# Patient Record
Sex: Female | Born: 1945 | Race: White | Hispanic: No | Marital: Married | Smoking: Current every day smoker
Health system: Southern US, Community
[De-identification: ages and names within clinical notes are randomized; demographics above are authoritative.]

## PROBLEM LIST (undated history)

## (undated) DIAGNOSIS — Z9849 Cataract extraction status, unspecified eye: Secondary | ICD-10-CM

## (undated) DIAGNOSIS — Z8673 Personal history of transient ischemic attack (TIA), and cerebral infarction without residual deficits: Secondary | ICD-10-CM

## (undated) DIAGNOSIS — I10 Essential (primary) hypertension: Secondary | ICD-10-CM

## (undated) DIAGNOSIS — E119 Type 2 diabetes mellitus without complications: Secondary | ICD-10-CM

## (undated) DIAGNOSIS — C52 Malignant neoplasm of vagina: Secondary | ICD-10-CM

## (undated) DIAGNOSIS — E785 Hyperlipidemia, unspecified: Secondary | ICD-10-CM

## (undated) HISTORY — PX: BREAST BIOPSY: SHX20

## (undated) HISTORY — DX: Hyperlipidemia, unspecified: E78.5

## (undated) HISTORY — DX: Type 2 diabetes mellitus without complications: E11.9

## (undated) HISTORY — DX: Personal history of transient ischemic attack (TIA), and cerebral infarction without residual deficits: Z86.73

## (undated) HISTORY — DX: Malignant neoplasm of vagina: C52

## (undated) HISTORY — DX: Cataract extraction status, unspecified eye: Z98.49

## (undated) HISTORY — DX: Essential (primary) hypertension: I10

## (undated) HISTORY — PX: ABDOMINAL HYSTERECTOMY: SHX81

---

## 2005-05-19 ENCOUNTER — Ambulatory Visit: Payer: Self-pay

## 2011-03-25 ENCOUNTER — Ambulatory Visit: Payer: Self-pay | Admitting: Ophthalmology

## 2011-06-03 ENCOUNTER — Ambulatory Visit: Payer: Self-pay | Admitting: Ophthalmology

## 2012-04-21 ENCOUNTER — Inpatient Hospital Stay: Payer: Self-pay | Admitting: Internal Medicine

## 2012-04-21 DIAGNOSIS — Z8673 Personal history of transient ischemic attack (TIA), and cerebral infarction without residual deficits: Secondary | ICD-10-CM

## 2012-04-21 HISTORY — DX: Personal history of transient ischemic attack (TIA), and cerebral infarction without residual deficits: Z86.73

## 2012-04-21 LAB — CBC WITH DIFFERENTIAL/PLATELET
Basophil %: 0.5 %
Eosinophil %: 0.9 %
HCT: 43.2 % (ref 35.0–47.0)
HGB: 14.7 g/dL (ref 12.0–16.0)
Lymphocyte #: 2.2 10*3/uL (ref 1.0–3.6)
MCH: 31.7 pg (ref 26.0–34.0)
MCHC: 34.1 g/dL (ref 32.0–36.0)
MCV: 93 fL (ref 80–100)
Monocyte #: 0.5 x10 3/mm (ref 0.2–0.9)
Monocyte %: 5.8 %
Neutrophil #: 6.5 10*3/uL (ref 1.4–6.5)
Neutrophil %: 69.4 %
RBC: 4.65 10*6/uL (ref 3.80–5.20)

## 2012-04-21 LAB — CK TOTAL AND CKMB (NOT AT ARMC)
CK, Total: 62 U/L (ref 21–215)
CK-MB: 0.8 ng/mL (ref 0.5–3.6)

## 2012-04-21 LAB — COMPREHENSIVE METABOLIC PANEL
Alkaline Phosphatase: 87 U/L (ref 50–136)
Anion Gap: 7 (ref 7–16)
BUN: 14 mg/dL (ref 7–18)
Calcium, Total: 9.5 mg/dL (ref 8.5–10.1)
Chloride: 105 mmol/L (ref 98–107)
Co2: 27 mmol/L (ref 21–32)
EGFR (African American): >60
EGFR (Non-African Amer.): >60
Glucose: 201 mg/dL — ABNORMAL HIGH (ref 65–99)
SGOT(AST): 24 U/L (ref 15–37)
SGPT (ALT): 20 U/L (ref 12–78)
Total Protein: 7.5 g/dL (ref 6.4–8.2)

## 2012-04-21 LAB — LIPID PANEL
Cholesterol: 225 mg/dL — ABNORMAL HIGH (ref 0–200)
Triglycerides: 159 mg/dL (ref 0–200)

## 2012-04-21 LAB — PROTIME-INR
INR: 0.9
Prothrombin Time: 12 secs (ref 11.5–14.7)

## 2012-04-21 LAB — APTT: Activated PTT: 31.8 secs (ref 23.6–35.9)

## 2012-04-22 DIAGNOSIS — I517 Cardiomegaly: Secondary | ICD-10-CM

## 2012-04-22 LAB — CBC WITH DIFFERENTIAL/PLATELET
Lymphocyte #: 3.6 10*3/uL (ref 1.0–3.6)
Lymphocyte %: 36.4 %
MCHC: 32.9 g/dL (ref 32.0–36.0)
MCV: 94 fL (ref 80–100)
Monocyte #: 0.7 x10 3/mm (ref 0.2–0.9)
Monocyte %: 7.6 %
Neutrophil %: 53.5 %
Platelet: 196 10*3/uL (ref 150–440)
RDW: 14.1 % (ref 11.5–14.5)
WBC: 9.8 10*3/uL (ref 3.6–11.0)

## 2012-04-22 LAB — CK TOTAL AND CKMB (NOT AT ARMC)
CK-MB: 0.8 ng/mL (ref 0.5–3.6)
CK-MB: 1 ng/mL (ref 0.5–3.6)

## 2012-04-22 LAB — BASIC METABOLIC PANEL
Anion Gap: 7 (ref 7–16)
BUN: 13 mg/dL (ref 7–18)
Creatinine: 0.77 mg/dL (ref 0.60–1.30)
EGFR (African American): >60
EGFR (Non-African Amer.): >60
Osmolality: 288 (ref 275–301)
Sodium: 144 mmol/L (ref 136–145)

## 2012-04-22 LAB — TROPONIN I: Troponin-I: <0.02 ng/mL

## 2014-05-18 ENCOUNTER — Ambulatory Visit: Payer: Self-pay | Admitting: Family Medicine

## 2014-12-19 ENCOUNTER — Ambulatory Visit
Admit: 2014-12-19 | Disposition: A | Discharge status: Home / Self Care | Payer: Self-pay | Attending: Family Medicine | Admitting: Family Medicine

## 2014-12-19 NOTE — Discharge Summary (Signed)
PATIENT NAME:  [name], [name] MR#:  [id number] DATE OF BIRTH:  1945/11/16  DATE OF ADMISSION:  04/21/2012 DATE OF DISCHARGE:  04/23/2012   ADMITTING DIAGNOSIS: Left arm tingling and numbness as well as facial numbness.   DISCHARGE DIAGNOSES:  1. Left arm tingling and numbness due to acute cerebrovascular accident involving inferior posterior limb of the internal capsule, also tiny punctate acute infarct in the posterior aspect of the right parietal lobe.  2. Diet-controlled diabetes.  3. Accelerated hypertension on presentation, likely due to acute stroke.  4. Hyperlipidemia.  5. Nicotine addiction.   6. 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  7. Status post cataract surgery.  PERTINENT LABS AND EVALUATIONS: WBC 9.4, hemoglobin 14.7, platelet count 29. PT was 12, INR 0.9. Troponin was less than 0.02. CT scan of the head without contrast showing no acute intracranial abnormality. EKG showed sinus bradycardia. Hemoglobin A1c was 7.3. Fasting lipid panel: Total cholesterol 225, triglycerides 159, HDL 59, LDL 134. Troponin was less than 0.02. LFTs were normal. PA and lateral chest x-ray showed no acute abnormality. An echocardiogram of the heart showed no source of emboli identified. Normal ejection fraction. There is moderate concentric left ventricular hypertrophy and there is a flow pattern suggestive of impaired LV relaxation. Ultrasound of the bilateral carotid Dopplers showed no significant atherosclerotic plaques identified in the Dopplers and vertebral arteries were patent. MRI of the brain showed acute infarct in the posterior limb of the right internal capsule. Tiny punctate area of acute infarct in the posterior aspect of the right parietal lobe cannot be excluded.   HOSPITAL COURSE: Please refer to the History and Physical done by the admitting physician. The patient is a pleasant 69 year old Caucasian female with history of diet-controlled  diabetes who presented with left arm tingling, numbness, and facial droop. By the time she arrived in the ED a lot of her symptoms had resolved, except she continued to have left facial numbness and some numbness in her arm. She had a CT scan of the head which was negative. The patient was admitted for further evaluation for stroke work-up. An MRI did confirm she indeed had a stroke in the posterior limb right internal capsule. I discussed this case with Dr. Loletta Specter of neurology who stated that this is unlikely cardiac emboli. She did have carotid Doppler's which showed no significant stenosis. Echo showed no evidence of cardiac source of emboli noted. The patient was not on aspirin. She was started on aspirin. Her cholesterol was also noted to be elevated. She was started on cholesterol-lowering medications.  Most of her symptoms from this cerebrovascular accident have resolved. She is ambulating without any difficulty, does not need any rehab. The patient also was noted to have accelerated hypertension on presentation, likely due to acute stroke. Her blood pressure is currently stable. She will be started on a low dose antihypertensive. At this time she is stable for discharge. The patient also smokes. She was counseled regarding quitting of smoking.   DISCHARGE MEDICATIONS: 1. Aspirin 81 mg 1 tab p.o. daily.  2. Simvastatin 20 mg at bedtime.  3. Lisinopril 2.5 daily.   HOME OXYGEN: None.   DIET: Low sodium, low fat, low cholesterol, carbohydrate consistent diet.   ACTIVITY: As tolerated.  REFERRAL:  Outpatient diabetic referral.   TIMEFRAME FOR FOLLOWUP: 1 to 2 weeks with [facility] or [facility] as a new patient.  The patient is recommended to stop smoking. She is also asked  to keep a log of her blood sugars to take to the primary M.D.  Faythe Ghee for the patient to return to work without any restrictions on Monday.   TIME SPENT:  35  minutes.  ____________________________ Lafonda Mosses Posey Pronto, MD shp:bjt D: 04/23/2012 10:36:46 ET T: 04/23/2012 11:13:37 ET JOB#: [id number]  cc: Chike Farrington H. Posey Pronto, MD, <Dictator> Alric Seton MD ELECTRONICALLY SIGNED 04/23/2012 16:03

## 2014-12-19 NOTE — H&P (Signed)
PATIENT NAME:  [name], FORDE MR#:  [id number] DATE OF BIRTH:  1945-11-30  DATE OF ADMISSION:  04/21/2012  PRIMARY CARE PHYSICIAN: None.   ADMITTING PHYSICIAN: Gladstone Lighter, MD   CHIEF COMPLAINT: Left arm tingling and numbness.   HISTORY OF PRESENT ILLNESS: [name] is a 69 year old Caucasian female with no significant past medical history other than borderline diabetes mellitus for which she is not on any medications, presents to the hospital with the above-mentioned complaints. The patient says she was fine this morning. About 2:00 p.m. this afternoon, she started feeling tingling in her left hand which attributed to a tight watch, so she took the watch out; and then she felt that the tingling and numbness was going all the way up along the left arm to her face, and she felt some tingling around the lip on the left side. She denies any change in her vision, speech, swallowing, or gait. Her symptoms are mostly resolved now. There is no tingling or numbness, but she still feels funny in her left arm when she is trying to lift it.   PAST MEDICAL HISTORY:  1. Borderline diabetes mellitus.  2. Vaginal carcinoma.   PAST SURGICAL HISTORY:  1. Hysterectomy with vaginal resection secondary to carcinoma.  2. Cataract surgery.   ALLERGIES: No known drug allergies.   CURRENT MEDICATIONS: None prescribed. Over-the-counter herbal medications.   SOCIAL HISTORY: She lives at home with her husband. She continues to smoke about 1/2 pack per day, used to smoke more than 2 packs in the past. She started smoking when she was 69 years old. No history of any alcohol use or drug abuse. She currently works at [employer] in [employer]. She used to work in [employer] in the past.   FAMILY HISTORY: Mother with heart disease. Father had atherosclerotic arterial disease.   REVIEW OF SYSTEMS: CONSTITUTIONAL: No fever, fatigue, or weakness. EYES: No blurred vision, double vision, glaucoma or cataracts. ENT:  Positive for mild hearing loss from her working in [employer]. No tinnitus, ear pain, epistaxis or discharge. RESPIRATORY: No cough, wheeze, hemoptysis, or chronic obstructive pulmonary disease. CARDIOVASCULAR: No chest pain, orthopnea, edema, arrhythmia, palpitations, or syncope. GI: No nausea, vomiting, abdominal pain, hematemesis, or melena. GENITOURINARY: No dysuria, hematuria, renal calculus, frequency, or incontinence. ENDOCRINE: No polyuria, nocturia, thyroid problems, heat or cold intolerance. HEMATOLOGY: No anemia, easy bruising or bleeding. SKIN: No acne, rash, or lesions. MUSCULOSKELETAL: No neck, back, shoulder pain, arthritis, or gout. NEUROLOGIC: Positive for numbness, tingling of left arm and also face. No prior history of cerebrovascular accident or transient ischemic attack or seizures. PSYCHOLOGICAL: No anxiety, insomnia, or depression.   PHYSICAL EXAMINATION:  VITAL SIGNS: Temperature 98.9 degrees Fahrenheit, pulse 67, respirations 20, blood pressure of 204/194, pulse oximetry 98% on room air.   GENERAL: A well built and well nourished female lying in bed, not in any acute distress.   HEENT: Normocephalic, atraumatic. Pupils are equal, round, reacting to light. Anicteric sclerae. Extraocular movements intact. Oropharynx clear without erythema, mass or exudates.   NECK: Supple. No thyromegaly, JVD, or carotid bruits. No lymphadenopathy.   LUNGS: Clear to auscultation bilaterally. No wheeze or crackles.    NECK: Supple, no thyromegaly, JVD, or carotid bruits. No lymphadenopathy.   LUNGS: Clear to auscultation bilaterally. Some rhonchi heard in both bases. No wheeze or crackles. No use of accessory muscles for breathing.   CARDIOVASCULAR: S1, S2 regular rate and rhythm. No murmurs, rubs, or gallops.   ABDOMEN: Soft, nontender, nondistended. No  hepatosplenomegaly. Normal bowel sounds.   EXTREMITIES: No pedal edema. No clubbing or cyanosis. 2+ dorsalis pedis pulses  bilaterally.   SKIN: No acne, rash, or lesions.   LYMPH: No cervical lymphadenopathy.   NEUROLOGICAL: Cranial nerves are intact. Her strength is five out of five both lower extremities. Strength in the right upper extremity is 5 out of 5 and left upper extremity is 5 out of 5 in the proximal muscles and also hand grip but appears 4+ out of 5 in the distal forearm muscles. Her knee jerk reflexes, ankle jerk and also biceps are 2+ on exam. There are no  sensory changes to touch and temperature on my examination at this time.    PSYCHOLOGICAL: The patient is awake, alert and oriented x3.   LABORATORY, DIAGNOSTIC AND RADIOLOGICAL DATA: WBC 9.4, hemoglobin 14.7, hematocrit 43.2, platelet count 29. Comprehensive metabolic panel is pending. PT 12.0, INR 0.9, PTT 31.8. Troponin less than 0.02. CT of the head without contrast showing no acute intracranial abnormality. EKG showing sinus bradycardia, heart rate of 58.   ASSESSMENT AND PLAN: A 69 year old female with no significant past medical history, borderline diabetes mellitus, admitted for left facial and also arm tingling and numbness which was resolved also now.   1. Acute transient ischemic attack/cerebrovascular accident: Symptoms resolved. The patient still feels funny lifting her left arm. Since CT is negative, we will  admit and do MRI of the brain, carotid Dopplers, echocardiogram. Monitor on telemetry and no neuro checks frequently.  2. PT consult.  3. Started on aspirin since she was not taking any antiplatelet agents prior to coming to the hospital and also statin while lipid panel is pending at this time.   4. 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 We will not drastically drop the blood pressure to maintain cerebral perfusion at this time. Do IV hydralazine p.r.n. at this time and add medications as needed.  5. Borderline diabetes mellitus: Hemoglobin A1c is pending, on sliding scale insulin.  6. Tobacco use  disorder: Counseled for three minutes, and the patient refused nicotine patch.  7. GI and deep vein thrombosis prophylaxis: On ranitidine and Lovenox.   CODE STATUS:  FULL CODE.      TIME SPENT ON ADMISSION: 50 minutes   ____________________________ Gladstone Lighter, MD [id number] D: 04/21/2012 17:12:12 ET T: 04/21/2012 18:18:44 ET JOB#: [id number]  cc: Gladstone Lighter, MD, <Dictator> Gladstone Lighter MD ELECTRONICALLY SIGNED 04/21/2012 20:32

## 2015-07-07 ENCOUNTER — Encounter: Payer: Self-pay | Admitting: Cardiology

## 2017-02-23 ENCOUNTER — Other Ambulatory Visit: Payer: Self-pay | Admitting: Family Medicine

## 2017-02-23 DIAGNOSIS — Z1231 Encounter for screening mammogram for malignant neoplasm of breast: Secondary | ICD-10-CM

## 2017-03-02 ENCOUNTER — Encounter: Payer: Self-pay | Admitting: Radiology

## 2017-03-02 ENCOUNTER — Ambulatory Visit
Admission: RE | Admit: 2017-03-02 | Discharge: 2017-03-02 | Disposition: A | Discharge status: Home / Self Care | Payer: Medicare PPO | Source: Ambulatory Visit | Attending: Family Medicine | Admitting: Family Medicine

## 2017-03-02 DIAGNOSIS — Z1231 Encounter for screening mammogram for malignant neoplasm of breast: Secondary | ICD-10-CM | POA: Insufficient documentation

## 2017-04-28 ENCOUNTER — Ambulatory Visit
Admission: EM | Admit: 2017-04-28 | Discharge: 2017-04-28 | Disposition: A | Discharge status: Home / Self Care | Payer: Medicare PPO | Attending: Family Medicine | Admitting: Family Medicine

## 2017-04-28 ENCOUNTER — Encounter: Payer: Self-pay | Admitting: *Deleted

## 2017-04-28 DIAGNOSIS — R42 Dizziness and giddiness: Secondary | ICD-10-CM | POA: Diagnosis not present

## 2017-04-28 DIAGNOSIS — A084 Viral intestinal infection, unspecified: Secondary | ICD-10-CM

## 2017-04-28 LAB — COMPREHENSIVE METABOLIC PANEL
ALBUMIN: 3.5 g/dL (ref 3.5–5.0)
ALT: 17 U/L (ref 14–54)
ANION GAP: 8 (ref 5–15)
AST: 22 U/L (ref 15–41)
Alkaline Phosphatase: 75 U/L (ref 38–126)
BILIRUBIN TOTAL: 0.2 mg/dL — AB (ref 0.3–1.2)
BUN: 22 mg/dL — AB (ref 6–20)
CALCIUM: 9.3 mg/dL (ref 8.9–10.3)
CO2: 25 mmol/L (ref 22–32)
CREATININE: 1.02 mg/dL — AB (ref 0.44–1.00)
Chloride: 100 mmol/L — ABNORMAL LOW (ref 101–111)
GFR calc Af Amer: >60 mL/min (ref >60)
GFR calc non Af Amer: 54 mL/min — ABNORMAL LOW (ref >60)
GLUCOSE: 288 mg/dL — AB (ref 65–99)
Potassium: 4 mmol/L (ref 3.5–5.1)
Sodium: 133 mmol/L — ABNORMAL LOW (ref 135–145)
Total Protein: 7.1 g/dL (ref 6.5–8.1)

## 2017-04-28 NOTE — ED Triage Notes (Signed)
Pt states she "didn't feel right" this morning when she got up and as day progressed began to feel more and more fatigues and dizzy. Denies pain or other symptoms. Negative slurred speech, arm drift, or facial ptosis.

## 2017-04-28 NOTE — ED Provider Notes (Signed)
[facility]    CSN: [id number] Arrival date & time: 04/28/17  1756     History   Chief Complaint Chief Complaint  Patient presents with  . Dizziness  . Fatigue    HPI Harry Bark is a 71 y.o. female.   71 yo female with a c/o general fatigue/weakness today and an episode of lightheadedness this afternoon at work. Denies syncope. States she's been having watery diarrhea for the last week. Denies any fevers, chills, melena, hematochezia, chest pains, shortness of breath, numbness/tingling, vision changes.    The history is provided by the patient.  Dizziness    Past Medical History:  Diagnosis Date  . 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 Diabetes mellitus type 2, controlled ([place])   . Essential hypertension   . H/O: CVA (cerebrovascular accident) 04/21/2012   inferior posterior limb of the internal capsule, also tiny punctate acute infarct in the posterior aspect of the right parietal lobe  . Hyperlipidemia   . Status post cataract surgery     There are no active problems to display for this patient.   Past Surgical History:  Procedure Laterality Date  . ABDOMINAL HYSTERECTOMY     vaginal resection  . BREAST BIOPSY Left    neg    OB History    No data available       Home Medications    Prior to Admission medications   Medication Sig Start Date End Date Taking? Authorizing Provider  metFORMIN (GLUCOPHAGE) 500 MG tablet Take by mouth 2 (two) times daily with a meal.   Yes [provider]    Family History Family History  Problem Relation Age of Onset  . Peripheral vascular disease Father   . Heart disease Mother   . Breast cancer Neg Hx     Social History Social History  Substance Use Topics  . Smoking status: Current Every Day Smoker  . Smokeless tobacco: Never Used  . Alcohol use No     Allergies   Patient has no known allergies.   Review of Systems Review  of Systems  Neurological: Positive for dizziness.     Physical Exam Triage Vital Signs ED Triage Vitals  Enc Vitals Group     BP 04/28/17 1834 (!) 197/61     Pulse Rate 04/28/17 1834 63     Resp 04/28/17 1834 16     Temp 04/28/17 1834 98.3 F (36.8 C)     Temp Source 04/28/17 1834 Oral     SpO2 04/28/17 1834 98 %     Weight 04/28/17 1837 160 lb (72.6 kg)     Height 04/28/17 1837 5\' 3"  (1.6 m)     Head Circumference --      Peak Flow --      Pain Score --      Pain Loc --      Pain Edu? --      Excl. in Impact? --    No data found.   Updated Vital Signs BP (!) 188/84 (BP Location: Left Arm)   Pulse 63   Temp 98.3 F (36.8 C) (Oral)   Resp 16   Ht 5\' 3"  (1.6 m)   Wt 160 lb (72.6 kg)   SpO2 98%   BMI 28.34 kg/m   Visual Acuity Right Eye Distance:   Left Eye Distance:   Bilateral Distance:    Right Eye Near:   Left Eye Near:  Bilateral Near:     Physical Exam  Constitutional: She is oriented to person, place, and time. She appears well-developed and well-nourished. No distress.  HENT:  Head: Normocephalic and atraumatic.  Right Ear: Tympanic membrane, external ear and ear canal normal.  Left Ear: Tympanic membrane, external ear and ear canal normal.  Nose: No mucosal edema, rhinorrhea, nose lacerations, sinus tenderness, nasal deformity, septal deviation or nasal septal hematoma. No epistaxis.  No foreign bodies. Right sinus exhibits no maxillary sinus tenderness and no frontal sinus tenderness. Left sinus exhibits no maxillary sinus tenderness and no frontal sinus tenderness.  Mouth/Throat: Uvula is midline, oropharynx is clear and moist and mucous membranes are normal. No oropharyngeal exudate.  Eyes: Pupils are equal, round, and reactive to light. Conjunctivae and EOM are normal. Right eye exhibits no discharge. Left eye exhibits no discharge. No scleral icterus.  Neck: Normal range of motion. Neck supple. No thyromegaly present.  Cardiovascular: Normal rate,  regular rhythm and normal heart sounds.   Pulmonary/Chest: Effort normal and breath sounds normal. No respiratory distress. She has no wheezes. She has no rales.  Abdominal: Soft. Bowel sounds are normal. She exhibits no distension and no mass. There is no tenderness. There is no rebound and no guarding. No hernia.  Lymphadenopathy:    She has no cervical adenopathy.  Neurological: She is alert and oriented to person, place, and time. She displays normal reflexes. No cranial nerve deficit or sensory deficit. She exhibits normal muscle tone. Coordination normal.  Skin: She is not diaphoretic.  Nursing note and vitals reviewed.    UC Treatments / Results  Labs (all labs ordered are listed, but only abnormal results are displayed) Labs Reviewed  COMPREHENSIVE METABOLIC PANEL - Abnormal; Notable for the following:       Result Value   Sodium 133 (*)    Chloride 100 (*)    Glucose, Bld 288 (*)    BUN 22 (*)    Creatinine, Ser 1.02 (*)    Total Bilirubin 0.2 (*)    GFR calc non Af Amer 54 (*)    All other components within normal limits    EKG  EKG Interpretation None       Radiology No results found.  Procedures Procedures (including critical care time)  Medications Ordered in UC Medications - No data to display   Initial Impression / Assessment and Plan / UC Course  I have reviewed the triage vital signs and the nursing notes.  Pertinent labs & imaging results that were available during my care of the patient were reviewed by me and considered in my medical decision making (see chart for details).       Final Clinical Impressions(s) / UC Diagnoses   Final diagnoses:  Viral gastroenteritis  Lightheadedness    New Prescriptions Discharge Medication List as of 04/28/2017  8:55 PM     1. Lab results and diagnosis reviewed with patient 2. Recommend supportive treatment with increased fluids, Imodium AD prn; continue home medications 3. Follow-up prn if symptoms  worsen or don't improve  Controlled Substance Prescriptions [place] Controlled Substance Registry consulted? Not Applicable   Norval Gable, MD 04/28/17 2103

## 2017-04-28 NOTE — Discharge Instructions (Signed)
Increase fluids Follow up with primary care provider

## 2017-12-23 ENCOUNTER — Other Ambulatory Visit: Payer: Self-pay | Admitting: Family Medicine

## 2017-12-23 DIAGNOSIS — Z1231 Encounter for screening mammogram for malignant neoplasm of breast: Secondary | ICD-10-CM

## 2019-03-17 ENCOUNTER — Other Ambulatory Visit: Payer: Self-pay | Admitting: Family Medicine

## 2019-03-17 DIAGNOSIS — Z1231 Encounter for screening mammogram for malignant neoplasm of breast: Secondary | ICD-10-CM

## 2019-04-05 ENCOUNTER — Other Ambulatory Visit: Payer: Self-pay

## 2019-04-05 ENCOUNTER — Ambulatory Visit
Admission: RE | Admit: 2019-04-05 | Discharge: 2019-04-05 | Disposition: A | Discharge status: Home / Self Care | Payer: Medicare PPO | Source: Ambulatory Visit | Attending: Family Medicine | Admitting: Family Medicine

## 2019-04-05 DIAGNOSIS — Z1231 Encounter for screening mammogram for malignant neoplasm of breast: Secondary | ICD-10-CM | POA: Diagnosis not present

## 2020-03-22 ENCOUNTER — Other Ambulatory Visit: Payer: Self-pay | Admitting: Family Medicine

## 2020-03-22 DIAGNOSIS — Z1231 Encounter for screening mammogram for malignant neoplasm of breast: Secondary | ICD-10-CM

## 2020-04-03 ENCOUNTER — Other Ambulatory Visit: Payer: Self-pay | Admitting: Family Medicine

## 2020-04-03 DIAGNOSIS — M949 Disorder of cartilage, unspecified: Secondary | ICD-10-CM

## 2020-04-03 DIAGNOSIS — M899 Disorder of bone, unspecified: Secondary | ICD-10-CM

## 2020-10-04 IMAGING — MG DIGITAL SCREENING BILATERAL MAMMOGRAM WITH TOMO AND CAD
8 series · 8 of 24 positions shown · non-contrast
Comparison: Previous exam(s).

CLINICAL DATA: Screening.

EXAM:
DIGITAL SCREENING BILATERAL MAMMOGRAM WITH TOMO AND CAD

[R CC synth-2D]
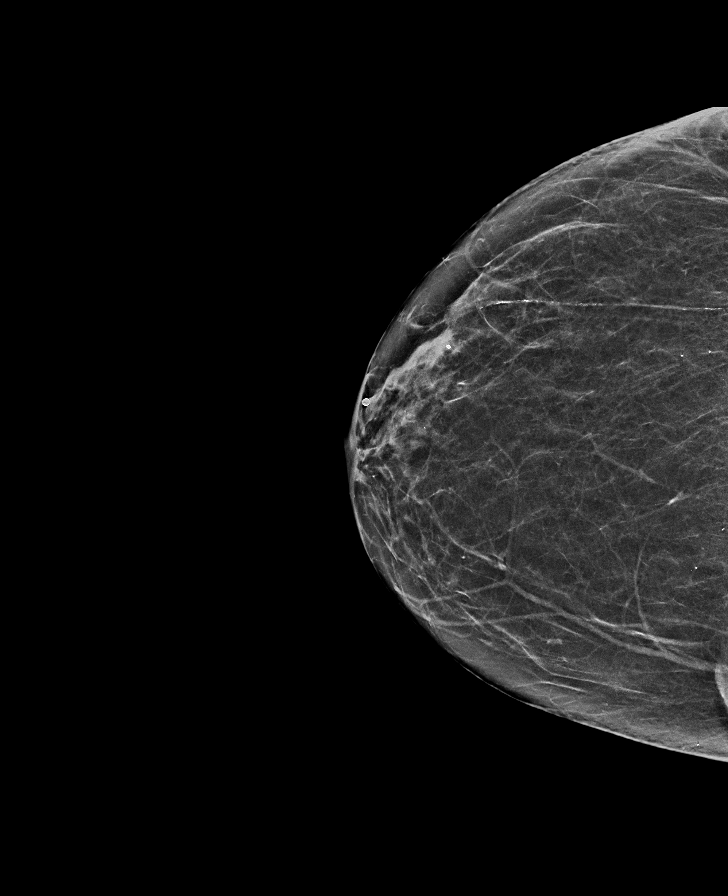

[L MLO synth-2D]
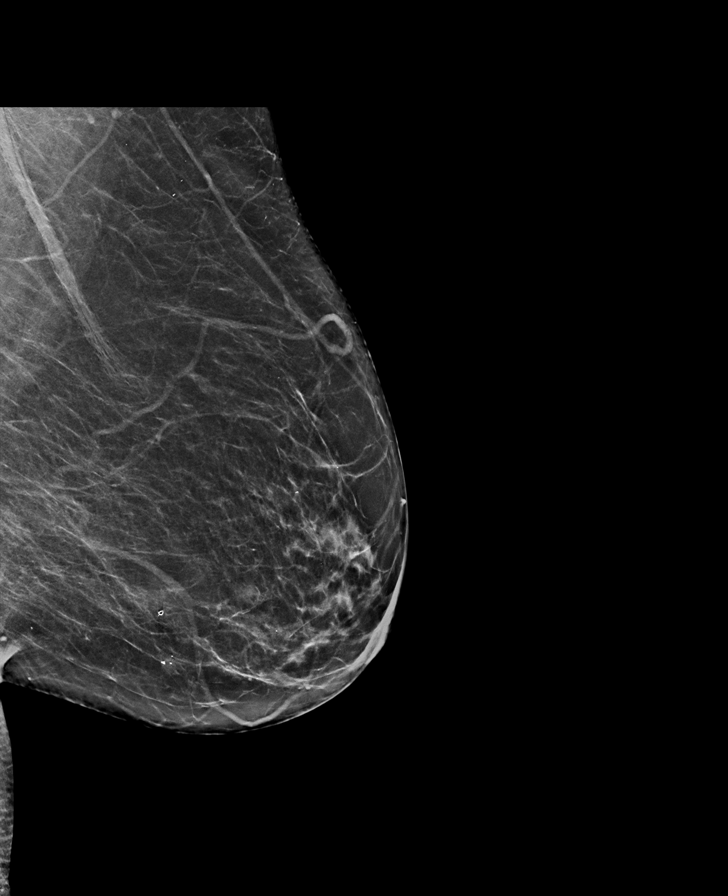

[R MLO synth-2D]
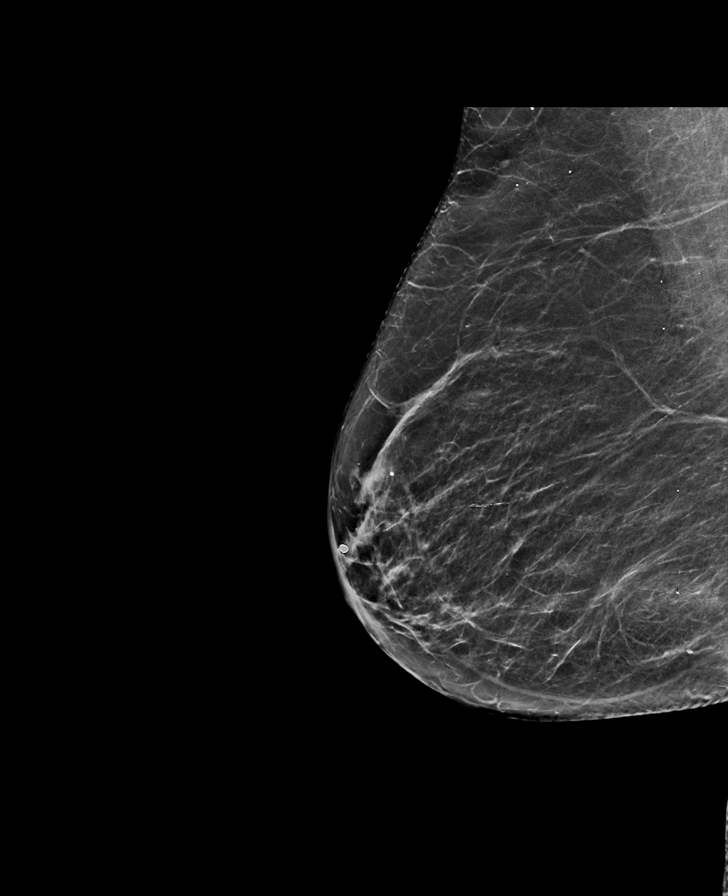

[L CC synth-2D]
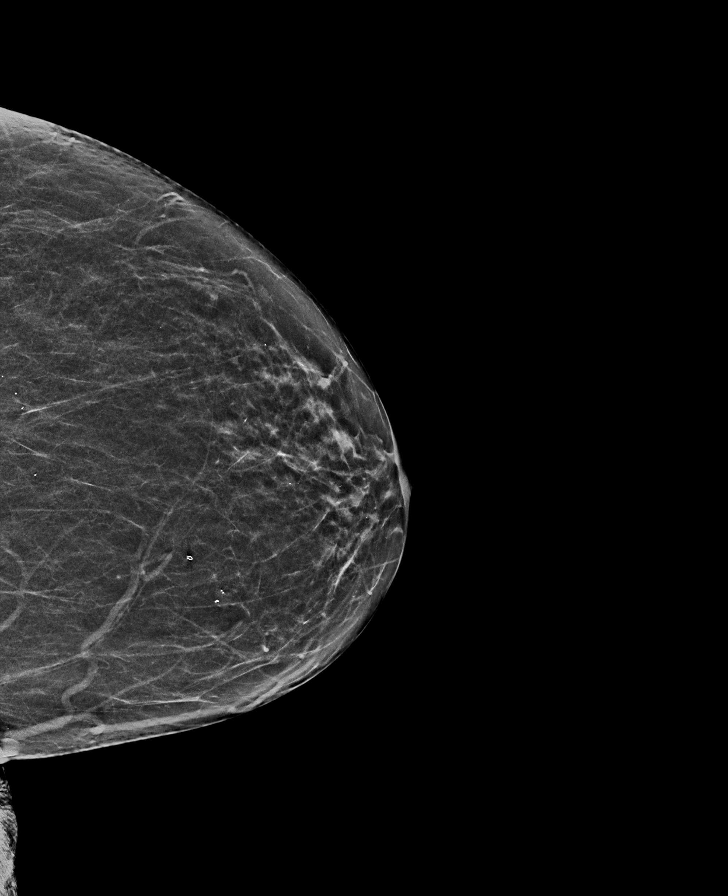

[R MLO tomo · tomo slice 31/62.0]
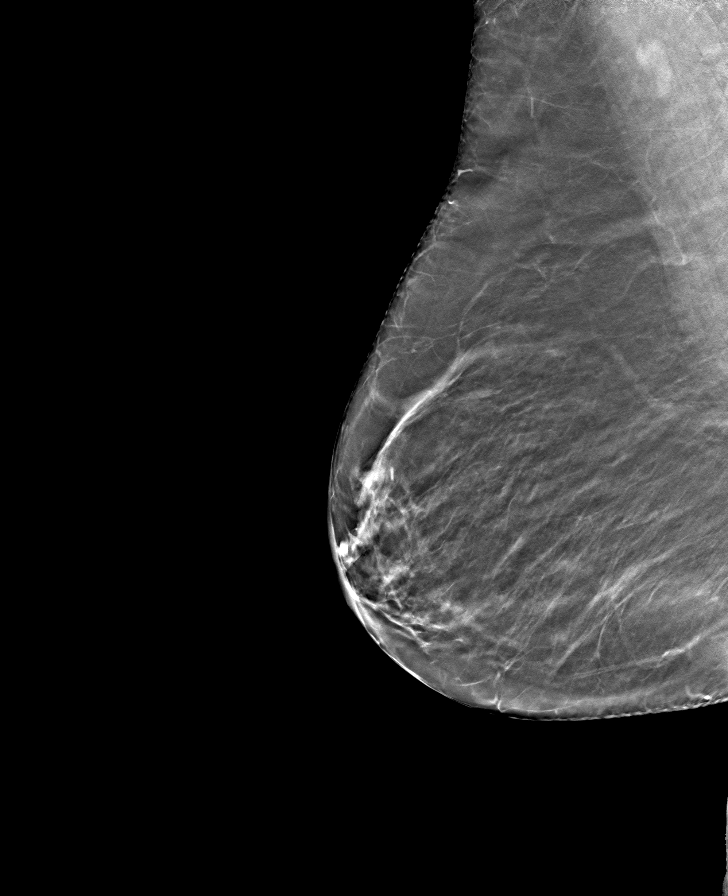

[R CC tomo · tomo slice 29/56.0]
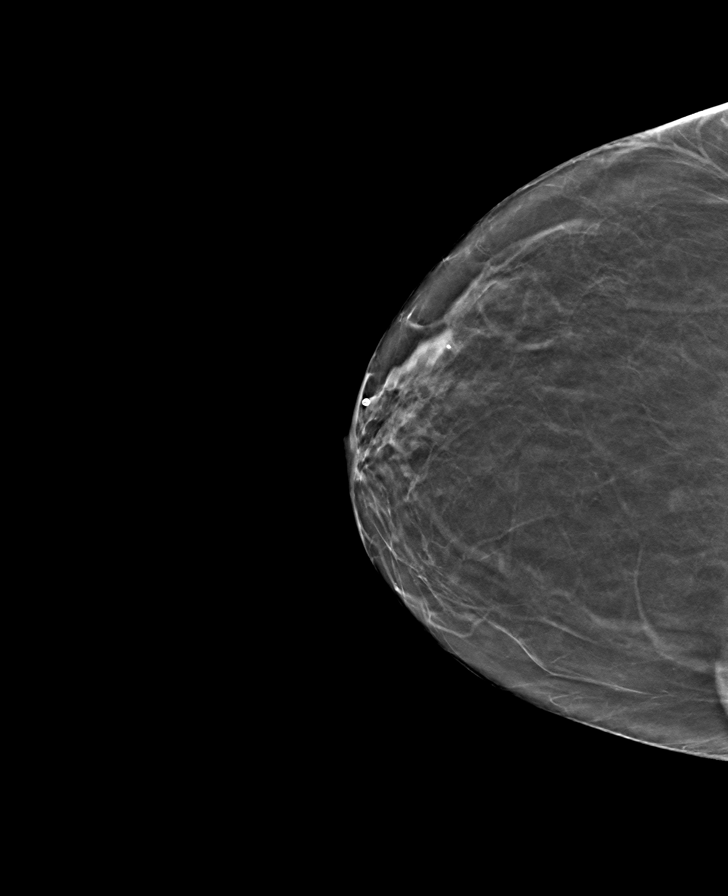

[L CC tomo · tomo slice 28/55.0]
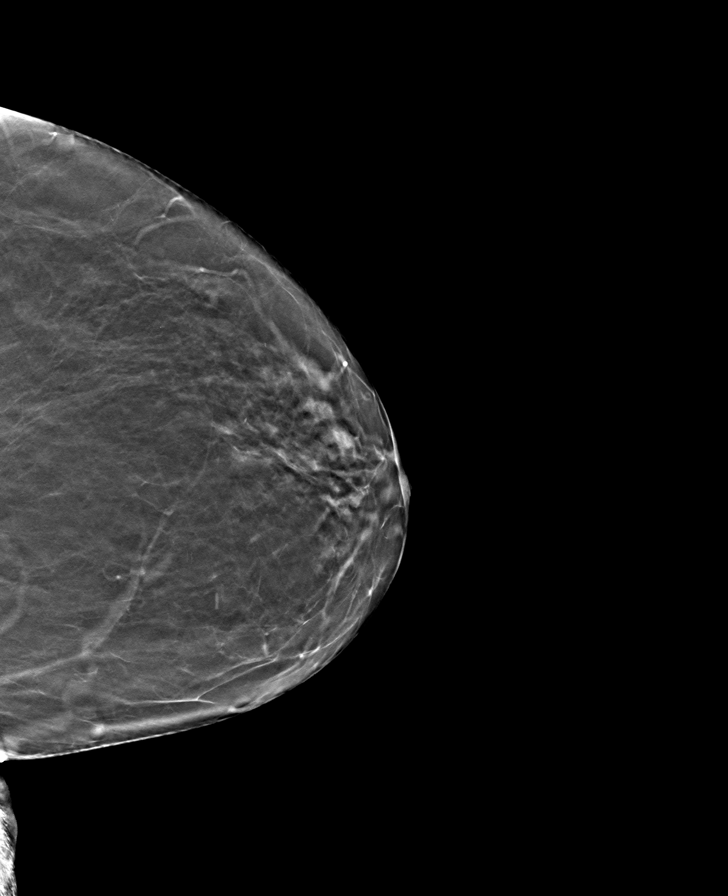

[L MLO tomo · tomo slice 35/68.0]
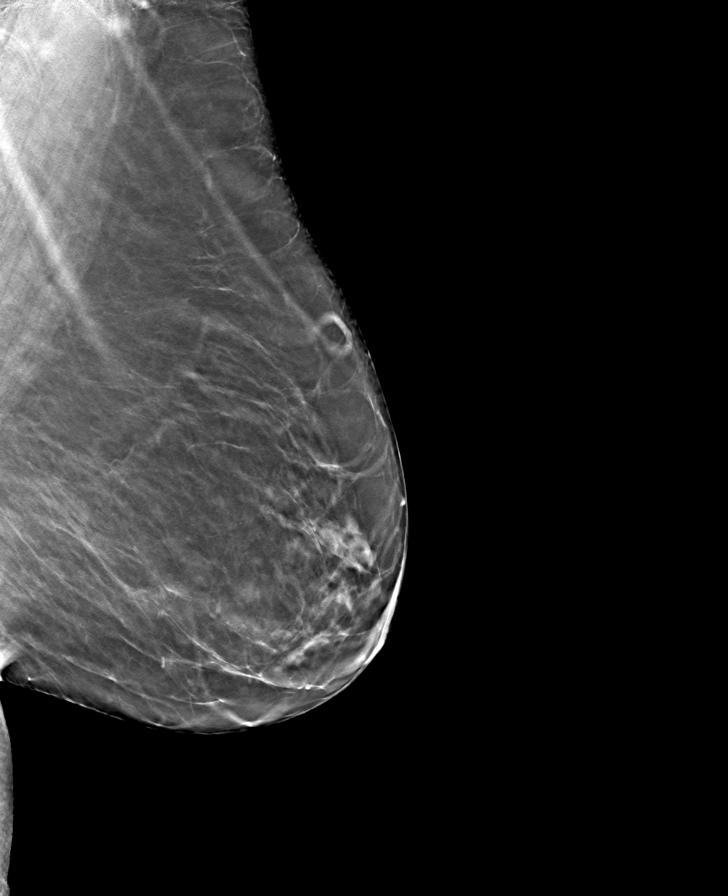

[8 of 24 positions shown; findings below may reference images not displayed]

ACR Breast Density Category b: There are scattered areas of
fibroglandular density.
FINDINGS: There are no findings suspicious for malignancy. Images were
processed with CAD.
IMPRESSION: No mammographic evidence of malignancy. A result letter of this
screening mammogram will be mailed directly to the patient.

RECOMMENDATION:
Screening mammogram in one year. (Code:CN-U-775)

BI-RADS CATEGORY  1: Negative.

## 2021-09-25 ENCOUNTER — Other Ambulatory Visit: Payer: Self-pay

## 2021-09-25 ENCOUNTER — Ambulatory Visit
Admission: EM | Admit: 2021-09-25 | Discharge: 2021-09-25 | Disposition: A | Discharge status: Home / Self Care | Payer: Medicare PPO

## 2021-09-25 ENCOUNTER — Encounter: Payer: Self-pay | Admitting: Emergency Medicine

## 2021-09-25 DIAGNOSIS — M545 Low back pain, unspecified: Secondary | ICD-10-CM

## 2021-09-25 DIAGNOSIS — M79604 Pain in right leg: Secondary | ICD-10-CM

## 2021-09-25 DIAGNOSIS — S60512A Abrasion of left hand, initial encounter: Secondary | ICD-10-CM | POA: Diagnosis not present

## 2021-09-25 MED ORDER — BACLOFEN 5 MG PO TABS: 5.0000 mg | ORAL_TABLET | Freq: Every evening | ORAL | 0 refills | Status: DC | PRN

## 2021-09-25 MED ORDER — NAPROXEN 500 MG PO TABS: 500.0000 mg | ORAL_TABLET | Freq: Two times a day (BID) | ORAL | 0 refills | Status: DC

## 2021-09-25 NOTE — Discharge Instructions (Signed)
Your pain is most likely caused by irritation to the muscles or ligaments.   You may use heating pad in 15 minute intervals as needed for additional comfort, within the first 2-3 days you may find comfort in using ice in 10-15 minutes over affected area  Begin stretching affected area daily for 10 minutes as tolerated to further loosen muscles   When lying down place pillow underneath and between knees for support  Can try sleeping without pillow on firm mattress   Practice good posture: head back, shoulders back, chest forward, pelvis back and weight distributed evenly on both legs  If pain persist after recommended treatment or reoccurs if may be beneficial to follow up with orthopedic specialist for evaluation, this doctor specializes in the bones and can manage your symptoms long-term with options such as but not limited to imaging, medications or physical therapy
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
## 2021-11-12 ENCOUNTER — Other Ambulatory Visit: Payer: Self-pay | Admitting: Family Medicine

## 2021-11-12 DIAGNOSIS — Z1231 Encounter for screening mammogram for malignant neoplasm of breast: Secondary | ICD-10-CM

## 2023-07-27 ENCOUNTER — Encounter: Payer: Self-pay | Admitting: Emergency Medicine

## 2023-07-27 ENCOUNTER — Inpatient Hospital Stay
Admission: EM | Admit: 2023-07-27 | Discharge: 2023-08-01 | DRG: 435 | Disposition: A | Discharge status: Home Health | Payer: Medicare HMO | Attending: Internal Medicine | Admitting: Internal Medicine

## 2023-07-27 ENCOUNTER — Ambulatory Visit
Admission: EM | Admit: 2023-07-27 | Discharge: 2023-07-27 | Payer: Medicare HMO | Attending: Internal Medicine | Admitting: Internal Medicine

## 2023-07-27 ENCOUNTER — Ambulatory Visit: Payer: Medicare HMO

## 2023-07-27 ENCOUNTER — Emergency Department: Payer: Medicare HMO

## 2023-07-27 ENCOUNTER — Other Ambulatory Visit: Payer: Self-pay

## 2023-07-27 DIAGNOSIS — Z8249 Family history of ischemic heart disease and other diseases of the circulatory system: Secondary | ICD-10-CM | POA: Diagnosis not present

## 2023-07-27 DIAGNOSIS — Z7984 Long term (current) use of oral hypoglycemic drugs: Secondary | ICD-10-CM | POA: Diagnosis not present

## 2023-07-27 DIAGNOSIS — F17210 Nicotine dependence, cigarettes, uncomplicated: Secondary | ICD-10-CM | POA: Diagnosis present

## 2023-07-27 DIAGNOSIS — R112 Nausea with vomiting, unspecified: Secondary | ICD-10-CM | POA: Diagnosis not present

## 2023-07-27 DIAGNOSIS — Z539 Procedure and treatment not carried out, unspecified reason: Secondary | ICD-10-CM | POA: Diagnosis present

## 2023-07-27 DIAGNOSIS — K8309 Other cholangitis: Secondary | ICD-10-CM | POA: Diagnosis present

## 2023-07-27 DIAGNOSIS — F40240 Claustrophobia: Secondary | ICD-10-CM | POA: Diagnosis present

## 2023-07-27 DIAGNOSIS — R54 Age-related physical debility: Secondary | ICD-10-CM | POA: Diagnosis present

## 2023-07-27 DIAGNOSIS — Z515 Encounter for palliative care: Secondary | ICD-10-CM | POA: Diagnosis not present

## 2023-07-27 DIAGNOSIS — C787 Secondary malignant neoplasm of liver and intrahepatic bile duct: Secondary | ICD-10-CM | POA: Diagnosis present

## 2023-07-27 DIAGNOSIS — E119 Type 2 diabetes mellitus without complications: Secondary | ICD-10-CM | POA: Diagnosis present

## 2023-07-27 DIAGNOSIS — R7989 Other specified abnormal findings of blood chemistry: Secondary | ICD-10-CM

## 2023-07-27 DIAGNOSIS — K831 Obstruction of bile duct: Secondary | ICD-10-CM | POA: Diagnosis present

## 2023-07-27 DIAGNOSIS — I10 Essential (primary) hypertension: Secondary | ICD-10-CM | POA: Diagnosis present

## 2023-07-27 DIAGNOSIS — E871 Hypo-osmolality and hyponatremia: Secondary | ICD-10-CM | POA: Diagnosis present

## 2023-07-27 DIAGNOSIS — Z8673 Personal history of transient ischemic attack (TIA), and cerebral infarction without residual deficits: Secondary | ICD-10-CM

## 2023-07-27 DIAGNOSIS — Z8542 Personal history of malignant neoplasm of other parts of uterus: Secondary | ICD-10-CM | POA: Diagnosis not present

## 2023-07-27 DIAGNOSIS — Z79899 Other long term (current) drug therapy: Secondary | ICD-10-CM | POA: Diagnosis not present

## 2023-07-27 DIAGNOSIS — R1011 Right upper quadrant pain: Secondary | ICD-10-CM | POA: Diagnosis not present

## 2023-07-27 DIAGNOSIS — Z90710 Acquired absence of both cervix and uterus: Secondary | ICD-10-CM | POA: Diagnosis not present

## 2023-07-27 DIAGNOSIS — C801 Malignant (primary) neoplasm, unspecified: Secondary | ICD-10-CM

## 2023-07-27 DIAGNOSIS — Z7985 Long-term (current) use of injectable non-insulin antidiabetic drugs: Secondary | ICD-10-CM | POA: Diagnosis not present

## 2023-07-27 DIAGNOSIS — E440 Moderate protein-calorie malnutrition: Secondary | ICD-10-CM | POA: Diagnosis present

## 2023-07-27 DIAGNOSIS — R1030 Lower abdominal pain, unspecified: Secondary | ICD-10-CM

## 2023-07-27 DIAGNOSIS — Z7982 Long term (current) use of aspirin: Secondary | ICD-10-CM | POA: Diagnosis not present

## 2023-07-27 DIAGNOSIS — Z6822 Body mass index (BMI) 22.0-22.9, adult: Secondary | ICD-10-CM | POA: Diagnosis not present

## 2023-07-27 DIAGNOSIS — N179 Acute kidney failure, unspecified: Secondary | ICD-10-CM | POA: Diagnosis present

## 2023-07-27 DIAGNOSIS — R1031 Right lower quadrant pain: Secondary | ICD-10-CM | POA: Diagnosis not present

## 2023-07-27 DIAGNOSIS — E785 Hyperlipidemia, unspecified: Secondary | ICD-10-CM | POA: Diagnosis present

## 2023-07-27 DIAGNOSIS — R160 Hepatomegaly, not elsewhere classified: Secondary | ICD-10-CM | POA: Diagnosis not present

## 2023-07-27 DIAGNOSIS — E876 Hypokalemia: Secondary | ICD-10-CM | POA: Diagnosis present

## 2023-07-27 DIAGNOSIS — D72829 Elevated white blood cell count, unspecified: Secondary | ICD-10-CM | POA: Diagnosis not present

## 2023-07-27 LAB — CBC
HCT: 34.7 % — ABNORMAL LOW (ref 36.0–46.0)
Hemoglobin: 11.9 g/dL — ABNORMAL LOW (ref 12.0–15.0)
MCH: 30.6 pg (ref 26.0–34.0)
MCHC: 34.3 g/dL (ref 30.0–36.0)
MCV: 89.2 fL (ref 80.0–100.0)
Platelets: 308 10*3/uL (ref 150–400)
RBC: 3.89 MIL/uL (ref 3.87–5.11)
RDW: 13.6 % (ref 11.5–15.5)
WBC: 14.4 10*3/uL — ABNORMAL HIGH (ref 4.0–10.5)
nRBC: 0 % (ref 0.0–0.2)

## 2023-07-27 LAB — COMPREHENSIVE METABOLIC PANEL
ALT: 269 U/L — ABNORMAL HIGH (ref 0–44)
AST: 162 U/L — ABNORMAL HIGH (ref 15–41)
Albumin: 3.4 g/dL — ABNORMAL LOW (ref 3.5–5.0)
Alkaline Phosphatase: 446 U/L — ABNORMAL HIGH (ref 38–126)
Anion gap: 14 (ref 5–15)
BUN: 38 mg/dL — ABNORMAL HIGH (ref 8–23)
CO2: 28 mmol/L (ref 22–32)
Calcium: 8.9 mg/dL (ref 8.9–10.3)
Chloride: 87 mmol/L — ABNORMAL LOW (ref 98–111)
Creatinine, Ser: 1.27 mg/dL — ABNORMAL HIGH (ref 0.44–1.00)
GFR, Estimated: 44 mL/min — ABNORMAL LOW (ref >60)
Glucose, Bld: 265 mg/dL — ABNORMAL HIGH (ref 70–99)
Potassium: 3.2 mmol/L — ABNORMAL LOW (ref 3.5–5.1)
Sodium: 129 mmol/L — ABNORMAL LOW (ref 135–145)
Total Bilirubin: 2.1 mg/dL — ABNORMAL HIGH (ref <1.2)
Total Protein: 6.7 g/dL (ref 6.5–8.1)

## 2023-07-27 LAB — LIPASE, BLOOD: Lipase: 60 U/L — ABNORMAL HIGH (ref 11–51)

## 2023-07-27 LAB — URINALYSIS, ROUTINE W REFLEX MICROSCOPIC
Bacteria, UA: NONE SEEN
Bilirubin Urine: NEGATIVE
Glucose, UA: >=500 mg/dL — AB
Ketones, ur: 5 mg/dL — AB
Leukocytes,Ua: NEGATIVE
Nitrite: NEGATIVE
Protein, ur: NEGATIVE mg/dL
Specific Gravity, Urine: 1.038 — ABNORMAL HIGH (ref 1.005–1.030)
pH: 6 (ref 5.0–8.0)

## 2023-07-27 MED ORDER — INSULIN ASPART 100 UNIT/ML IJ SOLN
0.0000 [IU] | Freq: Three times a day (TID) | INTRAMUSCULAR | Status: DC
  Administered 2023-07-28: 2 [IU] via SUBCUTANEOUS
  Administered 2023-07-28: 1 [IU] via SUBCUTANEOUS
  Administered 2023-07-29: 2 [IU] via SUBCUTANEOUS
  Administered 2023-07-30 (×2): 3 [IU] via SUBCUTANEOUS
  Administered 2023-07-30: 5 [IU] via SUBCUTANEOUS
  Administered 2023-07-31 (×2): 3 [IU] via SUBCUTANEOUS
  Administered 2023-08-01 (×2): 5 [IU] via SUBCUTANEOUS
  Filled 2023-07-27 (×9): qty 1

## 2023-07-27 MED ORDER — INSULIN ASPART 100 UNIT/ML IJ SOLN
0.0000 [IU] | Freq: Every day | INTRAMUSCULAR | Status: DC
  Administered 2023-07-28 – 2023-07-31 (×2): 2 [IU] via SUBCUTANEOUS
  Filled 2023-07-27 (×2): qty 1

## 2023-07-27 MED ORDER — HYDRALAZINE HCL 20 MG/ML IJ SOLN
10.0000 mg | INTRAMUSCULAR | Status: DC | PRN
  Administered 2023-07-28 – 2023-07-30 (×4): 10 mg via INTRAVENOUS
  Filled 2023-07-27 (×4): qty 1

## 2023-07-27 MED ORDER — ACETAMINOPHEN 325 MG PO TABS
650.0000 mg | ORAL_TABLET | Freq: Four times a day (QID) | ORAL | Status: AC | PRN
  Filled 2023-07-27: qty 2

## 2023-07-27 MED ORDER — HYDRALAZINE HCL 20 MG/ML IJ SOLN: 5.0000 mg | Freq: Four times a day (QID) | INTRAMUSCULAR | Status: DC | PRN

## 2023-07-27 MED ORDER — SODIUM CHLORIDE 0.9 % IV BOLUS
1000.0000 mL | Freq: Once | INTRAVENOUS | Status: AC
  Administered 2023-07-27: 1000 mL via INTRAVENOUS

## 2023-07-27 MED ORDER — METRONIDAZOLE 500 MG/100ML IV SOLN
500.0000 mg | Freq: Once | INTRAVENOUS | Status: AC
  Administered 2023-07-27: 500 mg via INTRAVENOUS
  Filled 2023-07-27: qty 100

## 2023-07-27 MED ORDER — SENNOSIDES-DOCUSATE SODIUM 8.6-50 MG PO TABS: 1.0000 | ORAL_TABLET | Freq: Every evening | ORAL | Status: DC | PRN

## 2023-07-27 MED ORDER — ACETAMINOPHEN 650 MG RE SUPP: 650.0000 mg | Freq: Four times a day (QID) | RECTAL | Status: AC | PRN

## 2023-07-27 MED ORDER — IOHEXOL 300 MG/ML  SOLN
80.0000 mL | Freq: Once | INTRAMUSCULAR | Status: AC | PRN
  Administered 2023-07-27: 80 mL via INTRAVENOUS

## 2023-07-27 MED ORDER — POTASSIUM CHLORIDE CRYS ER 20 MEQ PO TBCR
40.0000 meq | EXTENDED_RELEASE_TABLET | Freq: Once | ORAL | Status: AC
  Administered 2023-07-28: 40 meq via ORAL
  Filled 2023-07-27: qty 2

## 2023-07-27 MED ORDER — METRONIDAZOLE 500 MG/100ML IV SOLN
500.0000 mg | Freq: Two times a day (BID) | INTRAVENOUS | Status: DC
  Administered 2023-07-28 – 2023-07-29 (×3): 500 mg via INTRAVENOUS
  Filled 2023-07-27 (×4): qty 100

## 2023-07-27 MED ORDER — LACTATED RINGERS IV SOLN: INTRAVENOUS | Status: AC

## 2023-07-27 MED ORDER — SODIUM CHLORIDE 0.9 % IV SOLN
2.0000 g | Freq: Once | INTRAVENOUS | Status: AC
  Administered 2023-07-27: 2 g via INTRAVENOUS
  Filled 2023-07-27: qty 12.5

## 2023-07-27 MED ORDER — ONDANSETRON HCL 4 MG PO TABS: 4.0000 mg | ORAL_TABLET | Freq: Four times a day (QID) | ORAL | Status: DC | PRN

## 2023-07-27 MED ORDER — LACTATED RINGERS IV BOLUS (SEPSIS)
500.0000 mL | Freq: Once | INTRAVENOUS | Status: AC
  Administered 2023-07-27: 500 mL via INTRAVENOUS

## 2023-07-27 MED ORDER — ONDANSETRON HCL 4 MG/2ML IJ SOLN
4.0000 mg | Freq: Four times a day (QID) | INTRAMUSCULAR | Status: DC | PRN
  Administered 2023-07-29 – 2023-07-30 (×4): 4 mg via INTRAVENOUS
  Filled 2023-07-27 (×4): qty 2

## 2023-07-27 NOTE — H&P (Addendum)
History and Physical   [name] GNF:[id number] DOB: 06/27/1946 DOA: 07/27/2023  PCP: Rayetta Humphrey, MD  Patient coming from: Urgent care center via POV  I have personally briefly reviewed patient's old medical records in [facility] EMR.  Chief Concern: Abdominal pain  HPI: Ms. [name] is a 78 year old female with history of hypertension, non-insulin-dependent diabetes mellitus, hyperlipidemia, history of tobacco use, who presents to the emergency department for chief concerns of abdominal pain.  Vitals in the ED showed temperature of 98.3, respiration rate 20, heart rate of 68, blood pressure 172/73, SpO2 100% on room air.  Serum sodium is 129, potassium 3.2, chloride 87, bicarb 29, BUN of 38, serum creatinine 1.27, EGFR 44, nonfasting blood glucose 265, WBC 14.4, hemoglobin 11.9, platelets of 308.  UA was negative for leukocytes and nitrates.  ED treatment: Cefepime, metronidazole, sodium chloride 1 L bolus, LR 500 mL bolus, lactated ringer 150 mL/h. ----------------------------------- At bedside, patient is able to tell me her name, age, current calendar year.  Patient reports that she has been having belly pain for the last 3 to 4 days.  She reports that formally she would have diarrhea/loose bowel movements however over the last 4 days she has been constipated.  Patient denies chest pain, shortness of breath, nausea, vomiting, dysuria, hematuria, diarrhea.  She denies blood in her stool.  Patient reports a 12 pound weight loss over the last 3 to 4 months, however she has also recently been started on Ozempic.  She denies trauma to her person.  Social history: She lives at home with her husband.  She currently smokes 4 to 5 cigarettes/day and formally smoked 2 packs/day.  She denies EtOH and recreational drug use.  She is retired and previously worked in a [employer].  ROS: Constitutional: + weight change, no fever ENT/Mouth: no sore throat, no  rhinorrhea Eyes: no eye pain, no vision changes Cardiovascular: no chest pain, no dyspnea,  no edema, no palpitations Respiratory: no cough, no sputum, no wheezing Gastrointestinal: no nausea, no vomiting, no diarrhea, no constipation Genitourinary: no urinary incontinence, no dysuria, no hematuria Musculoskeletal: no arthralgias, no myalgias Skin: no skin lesions, no pruritus, Neuro: + weakness, no loss of consciousness, no syncope Psych: no anxiety, no depression, + decrease appetite Heme/Lymph: no bruising, no bleeding  ED Course: Discussed with the EDP, patient requiring hospitalization for chief concerns of ascending cholangitis.  Assessment/Plan  Principal Problem:   Elevated LFTs Active Problems:   Hypokalemia   Essential hypertension   Hyperlipidemia   Diabetes mellitus type 2, noninsulin dependent (H[facility])   AKI (acute kidney injury) (HCC)   Liver mass   Leukocytosis   Assessment and Plan:  * Elevated LFTs Suspect secondary to cholangitis complicated by possible metastasis versus hepatocellular carcinoma causing dilatation Etiology workup in progress Sepsis cannot be ruled out at this time given patient had leukocytosis of 14.4 with a possible source of ascending cholangitis Check lactic acid and procalcitonin on admission Blood cultures x 2 are in process Per EDP, GI has been consulted who states he will consult on the patient. N.p.o. after midnight for possible biliary stent placement by IR per GI discussion with IR Recheck liver enzymes in the a.m.  Leukocytosis Etiology workup in progress Differentials include sepsis, severe sepsis Procalcitonin, lactic acid x 2 were ordered on admission Blood cultures x 2 are in process Continue broad-spectrum cefepime with metronidazole 500 mg IV twice daily Recheck CBC in a.m.  Liver mass Differentials include metastasis versus  primary hepatocellular carcinoma causing biliary obstruction AM team to consult medical  oncology GI has been consulted by EDP We will keep patient n.p.o. after midnight for biliary stent placement  AKI (acute kidney injury) (HCC) Status post sodium chloride 1 L bolus, LR 500 mL bolus on admission followed by LR infusion at 150 mL/h Recheck BMP in the a.m.  Diabetes mellitus type 2, noninsulin dependent (HCC) Insulin SSI with at bedtime coverage ordered Home metformin and semaglutide not resumed on admission  Hyperlipidemia Home atorvastatin 40 mg daily not resumed on admission due to elevated LFTs  Essential hypertension Uncontrolled, suspect secondary to patient not yet taken her home antihypertensive medications prior to ED presentation Home lisinopril 5 daily not resumed on admission due to acute kidney injury Hydralazine 10 mg IV every 4 hours as needed for SBP greater than 170, 5 days of coverage ordered  Hypokalemia Check magnesium on admission Potassium chloride 40 mill colons p.o. one-time dose ordered Recheck BMP in the a.m.  Chart reviewed.   DVT prophylaxis: AM team to initiate pharmacologic DVT prophylaxis when the benefits outweigh the risk Code Status: Full code Diet: Heart healthy/carb modified; n.p.o. after midnight Family Communication: Updated daughter, Sylvie Farrier at bedside with patient's permission Disposition Plan: Pending clinical course, guarded prognosis Consults called: GI per EDP Admission status: Telemetry medical, inpatient  Past Medical History:  Diagnosis Date   Carcinoma of vaginal vault ([facility])    s/p Hysterectomy with vaginal resection   Diabetes mellitus type 2, controlled ([facility])    Essential hypertension    H/O: CVA (cerebrovascular accident) 04/21/2012   inferior posterior limb of the internal capsule, also tiny punctate acute infarct in the posterior aspect of the right parietal lobe   Hyperlipidemia    Status post cataract surgery    Past Surgical History:  Procedure Laterality Date   ABDOMINAL HYSTERECTOMY      vaginal resection   BREAST BIOPSY Left    bx/clip-neg   Social History:  reports that she has been smoking cigarettes. She has never used smokeless tobacco. She reports that she does not drink alcohol and does not use drugs.  Allergies  Allergen Reactions   Ezetimibe Other (See Comments)    heartburn   Simvastatin Other (See Comments)    Acid reflux symptoms   Niacin Other (See Comments)    Heartburn   Pravastatin Other (See Comments)    Acid reflux   Family History  Problem Relation Age of Onset   Peripheral vascular disease Father    Heart disease Mother    Breast cancer Neg Hx    Family history: Family history reviewed and not pertinent.  Prior to Admission medications   Medication Sig Start Date End Date Taking? Authorizing Provider  aspirin 81 MG chewable tablet Chew by mouth.    [provider]  baclofen 5 MG TABS Take 5 mg by mouth at bedtime as needed for muscle spasms. 09/25/21   White, Elita Boone, NP  lisinopril (ZESTRIL) 5 MG tablet Take 1 tablet by mouth 2 (two) times daily. 09/21/19   [provider]  metFORMIN (GLUCOPHAGE) 500 MG tablet Take by mouth 2 (two) times daily with a meal.    [provider]  naproxen (NAPROSYN) 500 MG tablet Take 1 tablet (500 mg total) by mouth 2 (two) times daily. 09/25/21   Valinda Hoar, NP  Semaglutide,0.25 or 0.5MG /DOS, 2 MG/3ML SOPN Inject into the skin. 04/07/23   [provider]   Physical Exam: Vitals:  07/27/23 2220 07/27/23 2240 07/27/23 2240 07/27/23 2300  BP: (!) 172/73  (!) 172/73 (!) 193/80  Pulse: 68  68 63  Resp:   20   Temp:  98.3 F (36.8 C)    TempSrc:  Oral    SpO2: 100%  100% 99%  Weight:      Height:       Constitutional: appears age-appropriate, frail, NAD, calm Eyes: PERRL, lids and conjunctivae normal ENMT: Mucous membranes are moist. Posterior pharynx clear of any exudate or lesions. Age-appropriate dentition. Hearing appropriate Neck: normal, supple, no masses,  no thyromegaly Respiratory: clear to auscultation bilaterally, no wheezing, no crackles. Normal respiratory effort. No accessory muscle use.  Cardiovascular: Regular rate and rhythm, no murmurs / rubs / gallops. No extremity edema. 2+ pedal pulses. No carotid bruits.  Abdomen: Generalized abdominal tenderness, especially on the right upper quadrant, no masses palpated, no hepatosplenomegaly. Bowel sounds positive.  Musculoskeletal: no clubbing / cyanosis. No joint deformity upper and lower extremities. Good ROM, no contractures, no atrophy. Normal muscle tone.  Skin: no rashes, lesions, ulcers. No induration Neurologic: Sensation intact. Strength 5/5 in all 4.  Psychiatric: Normal judgment and insight. Alert and oriented x 3. Normal mood.   EKG: Ordered and pending completion  X-ray on Admission: I personally reviewed and I agree with radiologist reading as below.  CT ABDOMEN PELVIS W CONTRAST  Result Date: 07/27/2023 CLINICAL DATA:  Right lower quadrant abdominal pain. No bowel movement in 3 days EXAM: CT ABDOMEN AND PELVIS WITH CONTRAST TECHNIQUE: Multidetector CT imaging of the abdomen and pelvis was performed using the standard protocol following bolus administration of intravenous contrast. RADIATION DOSE REDUCTION: This exam was performed according to the departmental dose-optimization program which includes automated exposure control, adjustment of the mA and/or kV according to patient size and/or use of iterative reconstruction technique. CONTRAST:  80mL OMNIPAQUE IOHEXOL 300 MG/ML  SOLN COMPARISON:  Same day abdominal radiograph FINDINGS: Lower chest: No acute abnormality. Hepatobiliary: Ill-defined focus of hypoattenuation in the left hepatic lobe superior to the gallbladder. This measures 4.1 x 2.4 cm. Normal gallbladder. The intrahepatic bile ducts are moderately dilated. No dilation of the common bile duct. No radiopaque stone. Pancreas: Unremarkable. No pancreatic ductal dilatation or  surrounding inflammatory changes. Spleen: Unremarkable. Adrenals/Urinary Tract: Bilateral adrenal gland hyperplasia. There is nodular thickening in the left adrenal gland measuring 1.5 cm mild enhancement (Hounsfield units 73). Low-attenuation lesions in the kidneys are statistically likely to represent cysts. No follow-up is required. No urinary calculi or hydronephrosis. Stomach/Bowel: Large stool ball in the rectum. Moderate colonic stool load. Extensive colonic diverticulosis without evidence of diverticulitis. Stomach is within normal limits. The appendix is normal. Vascular/Lymphatic: Aortic atherosclerosis. Portal vein is patent. No enlarged abdominal or pelvic lymph nodes. Reproductive: Status post hysterectomy. No adnexal masses. Other: No free intraperitoneal fluid or air. Musculoskeletal: No acute fracture or destructive osseous lesion. IMPRESSION: 1. Constipation with large stool ball in the rectum. 2. Ill-defined focus of hypoattenuation in the left hepatic lobe superior to the gallbladder measuring 4.1 cm. Given associated intrahepatic biliary dilation this is concerning for metastasis or primary HCC causing biliary obstruction. Nonemergent hepatic protocol MRI of the abdomen with and without contrast is recommended. 3. Hyperplasia of the bilateral adrenal glands with 1.5 cm nodular thickening in the left adrenal gland. This can also be further evaluated with MRI. 4. Extensive colonic diverticulosis without diverticulitis. Aortic Atherosclerosis (ICD10-I70.0). Electronically Signed   By: Minerva Fester M.D.   On: 07/27/2023 21:39  DG Abd 1 View  Result Date: 07/27/2023 CLINICAL DATA:  Abdomen pain EXAM: ABDOMEN - 1 VIEW COMPARISON:  None Available. FINDINGS: Nonobstructed gas pattern. Large stool in the colon and rectum. Numerous pelvic surgical clips. Pelvic phleboliths. IMPRESSION: Nonobstructed gas pattern with large stool in the colon and rectum suggesting constipation. Electronically Signed    By: Jasmine Pang M.D.   On: 07/27/2023 17:02    Labs on Admission: I have personally reviewed following labs  CBC: Recent Labs  Lab 07/27/23 1920  WBC 14.4*  HGB 11.9*  HCT 34.7*  MCV 89.2  PLT 308   Basic Metabolic Panel: Recent Labs  Lab 07/27/23 1920  NA 129*  K 3.2*  CL 87*  CO2 28  GLUCOSE 265*  BUN 38*  CREATININE 1.27*  CALCIUM 8.9   GFR: Estimated Creatinine Clearance: 30.7 mL/min (A) (by C-G formula based on SCr of 1.27 mg/dL (H)).  Liver Function Tests: Recent Labs  Lab 07/27/23 1920  AST 162*  ALT 269*  ALKPHOS 446*  BILITOT 2.1*  PROT 6.7  ALBUMIN 3.4*   Recent Labs  Lab 07/27/23 1920  LIPASE 60*   Urine analysis:    Component Value Date/Time   COLORURINE YELLOW (A) 07/27/2023 1921   APPEARANCEUR CLEAR (A) 07/27/2023 1921   LABSPEC 1.038 (H) 07/27/2023 1921   PHURINE 6.0 07/27/2023 1921   GLUCOSEU >=500 (A) 07/27/2023 1921   HGBUR SMALL (A) 07/27/2023 1921   BILIRUBINUR NEGATIVE 07/27/2023 1921   KETONESUR 5 (A) 07/27/2023 1921   PROTEINUR NEGATIVE 07/27/2023 1921   NITRITE NEGATIVE 07/27/2023 1921   LEUKOCYTESUR NEGATIVE 07/27/2023 1921   This document was prepared using Dragon Voice Recognition software and may include unintentional dictation errors.  Dr. Sedalia Muta [facility]  If 7PM-7AM, please contact overnight-coverage provider If 7AM-7PM, please contact day attending provider [email or web address]  07/27/2023, 11:38 PM

## 2023-07-27 NOTE — Assessment & Plan Note (Signed)
Status post sodium chloride 1 L bolus, LR 500 mL bolus on admission followed by LR infusion at 150 mL/h Recheck BMP in the a.m.

## 2023-07-27 NOTE — Assessment & Plan Note (Deleted)
Etiology workup in progress Sepsis cannot be ruled out at this time given patient had leukocytosis of 14.4 with a possible source of ascending cholangitis Check lactic acid and procalcitonin on admission Blood cultures x 2 are in process Per EDP, GI has been consulted **   ** Recheck liver enzymes in the a.m.

## 2023-07-27 NOTE — ED Notes (Signed)
Iv had to be restarted in left arm.  When taking out iv in right arm, tape tore skin.  Neosporin applied to right forearm.  Gauze dressing in place.   Md aware.

## 2023-07-27 NOTE — Assessment & Plan Note (Signed)
Home atorvastatin 40 mg daily not resumed on admission due to elevated LFTs

## 2023-07-27 NOTE — ED Notes (Signed)
Pt states she was sent from [facility] urgent care for eval of constipation, abd pain and possible appendicitis.  Vomit x 1 today.   last bm was 3 days ago.  Family with pt.   Pt alert  speech clear.  Iv in place. Labs sent.  Pt unable to void at this time.

## 2023-07-27 NOTE — Assessment & Plan Note (Addendum)
Etiology workup in progress Differentials include sepsis, severe sepsis Procalcitonin, lactic acid x 2 were ordered on admission Blood cultures x 2 are in process Continue broad-spectrum cefepime with metronidazole 500 mg IV twice daily Recheck CBC in a.m.

## 2023-07-27 NOTE — Assessment & Plan Note (Signed)
Differentials include metastasis versus primary hepatocellular carcinoma causing biliary obstruction AM team to consult medical oncology GI has been consulted by EDP We will keep patient n.p.o. after midnight for biliary stent placement

## 2023-07-27 NOTE — Assessment & Plan Note (Signed)
Insulin SSI with at bedtime coverage ordered Home metformin and semaglutide not resumed on admission

## 2023-07-27 NOTE — Discharge Instructions (Signed)
Please go to the ER for further workup of your symptoms

## 2023-07-27 NOTE — Sepsis Progress Note (Signed)
Following for sepsis monitoring. Requesting Lactic acid levels be collected if appropriate.

## 2023-07-27 NOTE — ED Provider Notes (Addendum)
[facility]    CSN: [id number] Arrival date & time: 07/27/23  1453      History   Chief Complaint Chief Complaint  Patient presents with   Back Pain    HPI [name] is a 77 y.o. female presents for abdominal pain.  Patient is accompanied by family member who helps to augment history.  Patient reports 3 to 4 days of an intermittent right lower/lower abdominal pain as well as low back pain.  She states it is a cramping/sharp pain that is associated with nausea and vomiting.  States she has not had a bowel movement in 3 to 4 days however member says it could be up to a week..  No dysuria, fevers.  No history of IBS, Crohn's, colitis.  Has a history of a hysterectomy but otherwise no abdominal surgeries.  Took both Maalox and Pepto-Bismol without improvement in symptoms.  States she is eating and drinking normally.  No other concerns at this time.   Back Pain Associated symptoms: abdominal pain     Past Medical History:  Diagnosis Date   Carcinoma of vaginal vault (HCC)    s/p Hysterectomy with vaginal resection   Diabetes mellitus type 2, controlled (HCC)    Essential hypertension    H/O: CVA (cerebrovascular accident) 04/21/2012   inferior posterior limb of the internal capsule, also tiny punctate acute infarct in the posterior aspect of the right parietal lobe   Hyperlipidemia    Status post cataract surgery     There are no problems to display for this patient.   Past Surgical History:  Procedure Laterality Date   ABDOMINAL HYSTERECTOMY     vaginal resection   BREAST BIOPSY Left    bx/clip-neg    OB History   No obstetric history on file.      Home Medications    Prior to Admission medications   Medication Sig Start Date End Date Taking? Authorizing Provider  aspirin 81 MG chewable tablet Chew by mouth.   Yes [provider]  lisinopril (ZESTRIL) 5 MG tablet Take 1 tablet by mouth 2 (two) times daily. 09/21/19  Yes  [provider]  metFORMIN (GLUCOPHAGE) 500 MG tablet Take by mouth 2 (two) times daily with a meal.   Yes [provider]  Semaglutide,0.25 or 0.5MG /DOS, 2 MG/3ML SOPN Inject into the skin. 04/07/23  Yes [provider]  baclofen 5 MG TABS Take 5 mg by mouth at bedtime as needed for muscle spasms. 09/25/21   White, Elita Boone, NP  naproxen (NAPROSYN) 500 MG tablet Take 1 tablet (500 mg total) by mouth 2 (two) times daily. 09/25/21   Valinda Hoar, NP    Family History Family History  Problem Relation Age of Onset   Peripheral vascular disease Father    Heart disease Mother    Breast cancer Neg Hx     Social History Social History   Tobacco Use   Smoking status: Every Day    Current packs/day: 0.25    Types: Cigarettes   Smokeless tobacco: Never  Vaping Use   Vaping status: Never Used  Substance Use Topics   Alcohol use: No   Drug use: No     Allergies   Ezetimibe, Simvastatin, Niacin, and Pravastatin   Review of Systems Review of Systems  Gastrointestinal:  Positive for abdominal pain, nausea and vomiting.  Musculoskeletal:  Positive for back pain.     Physical Exam Triage Vital Signs ED Triage Vitals  Encounter  Vitals Group     BP 07/27/23 1523 132/78     Systolic BP Percentile --      Diastolic BP Percentile --      Pulse Rate 07/27/23 1523 71     Resp 07/27/23 1523 16     Temp 07/27/23 1523 98.3 F (36.8 C)     Temp Source 07/27/23 1523 Oral     SpO2 07/27/23 1523 96 %     Weight --      Height --      Head Circumference --      Peak Flow --      Pain Score 07/27/23 1521 0     Pain Loc --      Pain Education --      Exclude from Growth Chart --    No data found.  Updated Vital Signs BP 132/78 (BP Location: Right Arm)   Pulse 71   Temp 98.3 F (36.8 C) (Oral)   Resp 16   SpO2 96%   Visual Acuity Right Eye Distance:   Left Eye Distance:   Bilateral Distance:    Right Eye Near:   Left Eye Near:    Bilateral  Near:     Physical Exam Vitals and nursing note reviewed.  Constitutional:      General: She is not in acute distress.    Appearance: Normal appearance. She is not ill-appearing.  HENT:     Head: Normocephalic and atraumatic.  Eyes:     Pupils: Pupils are equal, round, and reactive to light.  Cardiovascular:     Rate and Rhythm: Normal rate.  Pulmonary:     Effort: Pulmonary effort is normal.  Abdominal:     General: Bowel sounds are normal.     Palpations: Abdomen is soft.     Tenderness: There is abdominal tenderness in the right lower quadrant and periumbilical area.     Comments: Significant tenderness palpation to right lower quadrant and periumbilical area.  Skin:    General: Skin is warm and dry.  Neurological:     General: No focal deficit present.     Mental Status: She is alert and oriented to person, place, and time.  Psychiatric:        Mood and Affect: Mood normal.        Behavior: Behavior normal.      UC Treatments / Results  Labs (all labs ordered are listed, but only abnormal results are displayed) Labs Reviewed - No data to display   EKG   Radiology No results found.  Procedures Procedures (including critical care time)  Medications Ordered in UC Medications - No data to display  Initial Impression / Assessment and Plan / UC Course  I have reviewed the triage vital signs and the nursing notes.  Pertinent labs & imaging results that were available during my care of the patient were reviewed by me and considered in my medical decision making (see chart for details).     Reviewed exam and symptoms with patient and family member.  Discussed limitations and abilities of urgent care.  Patient presenting with pain/right lower quadrant pain with constipation x 1 week.  Unable to leave urine sample.  Wet read of abdominal x-ray does show stool burden.  Concern for severe constipation or impaction.  Patient very tender to palpation right lower  quadrant.  Family with patient does not know if she is ever had her appendix removed.  Advise giving her symptoms she should go  to the ER for further workup.  She is in agreement with plan will go POV to the ER with her family. Final Clinical Impressions(s) / UC Diagnoses   Final diagnoses:  Lower abdominal pain  RLQ abdominal pain     Discharge Instructions      Please go to the ER for further workup of your symptoms      ED Prescriptions   None    PDMP not reviewed this encounter.   Radford Pax, NP 07/27/23 1651    Radford Pax, NP 07/27/23 [phone]

## 2023-07-27 NOTE — Hospital Course (Addendum)
Ms. Rickiyah Greenwald is a 77 year old female with history of hypertension, non-insulin-dependent diabetes mellitus, hyperlipidemia, history of tobacco use, who presents to the emergency department for chief concerns of abdominal pain.  Vitals in the ED showed temperature of 98.3, respiration rate 20, heart rate of 68, blood pressure 172/73, SpO2 100% on room air.  Serum sodium is 129, potassium 3.2, chloride 87, bicarb 29, BUN of 38, serum creatinine 1.27, EGFR 44, nonfasting blood glucose 265, WBC 14.4, hemoglobin 11.9, platelets of 308.  UA was negative for leukocytes and nitrates.  ED treatment: Cefepime, metronidazole, sodium chloride 1 L bolus, LR 500 mL bolus, lactated ringer 150 mL

## 2023-07-27 NOTE — ED Triage Notes (Signed)
Patient to ED via POV for abd pain. Seen at [facility]- concerned for impaction or appendix. RLQ. No BM in 3 days.

## 2023-07-27 NOTE — Assessment & Plan Note (Addendum)
Uncontrolled, suspect secondary to patient not yet taken her home antihypertensive medications prior to ED presentation Home lisinopril 5 daily not resumed on admission due to acute kidney injury Hydralazine 10 mg IV every 4 hours as needed for SBP greater than 170, 5 days of coverage ordered

## 2023-07-27 NOTE — ED Notes (Signed)
Patient is being discharged from the Urgent Care and sent to the Emergency Department via POV . Per Cheri Rous NP, patient is in need of higher level of care due to abd pain. Patient is aware and verbalizes understanding of plan of care.  Vitals:   07/27/23 1523  BP: 132/78  Pulse: 71  Resp: 16  Temp: 98.3 F (36.8 C)  SpO2: 96%

## 2023-07-27 NOTE — Progress Notes (Signed)
ED Pharmacy Antibiotic Sign Off An antibiotic consult was received from an ED provider for cefepime per pharmacy dosing for sepsis. A chart review was completed to assess appropriateness.   The following one time order(s) were placed:  Cefepime 2 g IV x 1  Further antibiotic and/or antibiotic pharmacy consults should be ordered by the admitting provider if indicated.   Thank you for allowing pharmacy to be a part of this patient's care.   Merryl Hacker, [facility]  Clinical Pharmacist 07/27/23 9:55 PM

## 2023-07-27 NOTE — Assessment & Plan Note (Addendum)
Suspect secondary to cholangitis complicated by possible metastasis versus hepatocellular carcinoma causing dilatation Etiology workup in progress Sepsis cannot be ruled out at this time given patient had leukocytosis of 14.4 with a possible source of ascending cholangitis Check lactic acid and procalcitonin on admission Blood cultures x 2 are in process Per EDP, GI has been consulted who states he will consult on the patient. N.p.o. after midnight for possible biliary stent placement by IR per GI discussion with IR Recheck liver enzymes in the a.m.

## 2023-07-27 NOTE — ED Triage Notes (Signed)
Pt presents with lower back pain and abdominal pain off and on x 3-4 days. Pt reports no BM in the last 3-4 days.

## 2023-07-27 NOTE — ED Notes (Signed)
Pt called out because she needed to use the restroom, this tech helped pt to toilet in room. This pt also was asking about eating, this tech informed nurse about pt concern. This tech provided pt with [place] tray and let pt know that at 12 am she is NPO and can not have anything to eat. Pt understood. No other needs at the moment.

## 2023-07-27 NOTE — Progress Notes (Signed)
CODE SEPSIS - PHARMACY COMMUNICATION  **Broad Spectrum Antibiotics should be administered within 1 hour of Sepsis diagnosis**  Time Code Sepsis Called/Page Received: 2153  Antibiotics Ordered: Cefepime, metronidazole  Time of 1st antibiotic administration: 2212  Additional action taken by pharmacy: None  If necessary, Name of Provider/Nurse Contacted: None    Merryl Hacker ,PharmD Clinical Pharmacist  07/27/2023  9:54 PM

## 2023-07-27 NOTE — ED Provider Notes (Signed)
[facility] Provider Note   Event Date/Time   First MD Initiated Contact with Patient 07/27/23 1851     (approximate) History  Abdominal Pain  HPI [name] is a 77 y.o. female with a past medical history of CVA, hypertension, hyperlipidemia, and type 2 diabetes who presents complaining of right lower quadrant abdominal pain that has been present over the last 4 days with no bowel movement in the last 3 days.  Patient was seen in urgent care today with an x-ray concerning for impaction however with patient's right lower quadrant pain, they were sent to our emergency department for further evaluation regarding possible appendicitis.  Patient describes an aching pain that comes and goes in the right lower quadrant and is associated with decreased appetite, nausea, and vomiting. ROS: Patient currently denies any vision changes, tinnitus, difficulty speaking, facial droop, sore throat, chest pain, shortness of breath, diarrhea, dysuria, or weakness/numbness/paresthesias in any extremity   Physical Exam  Triage Vital Signs: ED Triage Vitals  Encounter Vitals Group     BP 07/27/23 1853 (!) 142/74     Systolic BP Percentile --      Diastolic BP Percentile --      Pulse Rate 07/27/23 1853 66     Resp 07/27/23 1853 18     Temp 07/27/23 1853 98.1 F (36.7 C)     Temp Source 07/27/23 1853 Oral     SpO2 07/27/23 1853 95 %     Weight 07/27/23 1851 125 lb (56.7 kg)     Height 07/27/23 1851 5\' 3"  (1.6 m)     Head Circumference --      Peak Flow --      Pain Score 07/27/23 1851 0     Pain Loc --      Pain Education --      Exclude from Growth Chart --    Most recent vital signs: Vitals:   07/27/23 2240 07/27/23 2240  BP:  (!) 172/73  Pulse:  68  Resp:  20  Temp: 98.3 F (36.8 C)   SpO2:  100%   General: Awake, oriented x4. CV:  Good peripheral perfusion.  Resp:  Normal effort.  Abd:  No distention.  Right-sided abdominal tenderness to  palpation Other:  Elderly well-developed, well-nourished Caucasian female resting comfortably in no acute distress ED Results / Procedures / Treatments  Labs (all labs ordered are listed, but only abnormal results are displayed) Labs Reviewed  LIPASE, BLOOD - Abnormal; Notable for the following components:      Result Value   Lipase 60 (*)    All other components within normal limits  COMPREHENSIVE METABOLIC PANEL - Abnormal; Notable for the following components:   Sodium 129 (*)    Potassium 3.2 (*)    Chloride 87 (*)    Glucose, Bld 265 (*)    BUN 38 (*)    Creatinine, Ser 1.27 (*)    Albumin 3.4 (*)    AST 162 (*)    ALT 269 (*)    Alkaline Phosphatase 446 (*)    Total Bilirubin 2.1 (*)    GFR, Estimated 44 (*)    All other components within normal limits  CBC - Abnormal; Notable for the following components:   WBC 14.4 (*)    Hemoglobin 11.9 (*)    HCT 34.7 (*)    All other components within normal limits  URINALYSIS, ROUTINE W REFLEX MICROSCOPIC - Abnormal; Notable for the following components:  Color, Urine YELLOW (*)    APPearance CLEAR (*)    Specific Gravity, Urine 1.038 (*)    Glucose, UA >=500 (*)    Hgb urine dipstick SMALL (*)    Ketones, ur 5 (*)    All other components within normal limits  CULTURE, BLOOD (ROUTINE X 2)  CULTURE, BLOOD (ROUTINE X 2)  BASIC METABOLIC PANEL  CBC  HEMOGLOBIN A1C  MAGNESIUM  LACTIC ACID, PLASMA  LACTIC ACID, PLASMA  HEPATIC FUNCTION PANEL  PROCALCITONIN   RADIOLOGY ED MD interpretation: CT of the abdomen and pelvis with IV contrast shows constipation with a large stool ball in the rectum as well as ill-defined focus of hyperattenuation in the left hepatic lobe superior to the gallbladder measuring 4.1 cm given the associated intrahepatic biliary dilation this is concerning for metastatic or primary HCC causing biliary obstruction -Agree with radiology assessment Official radiology report(s): CT ABDOMEN PELVIS W  CONTRAST  Result Date: 07/27/2023 CLINICAL DATA:  Right lower quadrant abdominal pain. No bowel movement in 3 days EXAM: CT ABDOMEN AND PELVIS WITH CONTRAST TECHNIQUE: Multidetector CT imaging of the abdomen and pelvis was performed using the standard protocol following bolus administration of intravenous contrast. RADIATION DOSE REDUCTION: This exam was performed according to the departmental dose-optimization program which includes automated exposure control, adjustment of the mA and/or kV according to patient size and/or use of iterative reconstruction technique. CONTRAST:  80mL OMNIPAQUE IOHEXOL 300 MG/ML  SOLN COMPARISON:  Same day abdominal radiograph FINDINGS: Lower chest: No acute abnormality. Hepatobiliary: Ill-defined focus of hypoattenuation in the left hepatic lobe superior to the gallbladder. This measures 4.1 x 2.4 cm. Normal gallbladder. The intrahepatic bile ducts are moderately dilated. No dilation of the common bile duct. No radiopaque stone. Pancreas: Unremarkable. No pancreatic ductal dilatation or surrounding inflammatory changes. Spleen: Unremarkable. Adrenals/Urinary Tract: Bilateral adrenal gland hyperplasia. There is nodular thickening in the left adrenal gland measuring 1.5 cm mild enhancement (Hounsfield units 73). Low-attenuation lesions in the kidneys are statistically likely to represent cysts. No follow-up is required. No urinary calculi or hydronephrosis. Stomach/Bowel: Large stool ball in the rectum. Moderate colonic stool load. Extensive colonic diverticulosis without evidence of diverticulitis. Stomach is within normal limits. The appendix is normal. Vascular/Lymphatic: Aortic atherosclerosis. Portal vein is patent. No enlarged abdominal or pelvic lymph nodes. Reproductive: Status post hysterectomy. No adnexal masses. Other: No free intraperitoneal fluid or air. Musculoskeletal: No acute fracture or destructive osseous lesion. IMPRESSION: 1. Constipation with large stool ball in  the rectum. 2. Ill-defined focus of hypoattenuation in the left hepatic lobe superior to the gallbladder measuring 4.1 cm. Given associated intrahepatic biliary dilation this is concerning for metastasis or primary HCC causing biliary obstruction. Nonemergent hepatic protocol MRI of the abdomen with and without contrast is recommended. 3. Hyperplasia of the bilateral adrenal glands with 1.5 cm nodular thickening in the left adrenal gland. This can also be further evaluated with MRI. 4. Extensive colonic diverticulosis without diverticulitis. Aortic Atherosclerosis (ICD10-I70.0). Electronically Signed   By: Minerva Fester M.D.   On: 07/27/2023 21:39   DG Abd 1 View  Result Date: 07/27/2023 CLINICAL DATA:  Abdomen pain EXAM: ABDOMEN - 1 VIEW COMPARISON:  None Available. FINDINGS: Nonobstructed gas pattern. Large stool in the colon and rectum. Numerous pelvic surgical clips. Pelvic phleboliths. IMPRESSION: Nonobstructed gas pattern with large stool in the colon and rectum suggesting constipation. Electronically Signed   By: Jasmine Pang M.D.   On: 07/27/2023 17:02   PROCEDURES: Critical Care performed: Yes, see  critical care procedure note(s) .1-3 Lead EKG Interpretation  Performed by: Merwyn Katos, MD Authorized by: Merwyn Katos, MD     Interpretation: normal     ECG rate:  71   ECG rate assessment: normal     Rhythm: sinus rhythm     Ectopy: none     Conduction: normal   CRITICAL CARE Performed by: Merwyn Katos  Total critical care time: 33 minutes  Critical care time was exclusive of separately billable procedures and treating other patients.  Critical care was necessary to treat or prevent imminent or life-threatening deterioration.  Critical care was time spent personally by me on the following activities: development of treatment plan with patient and/or surrogate as well as nursing, discussions with consultants, evaluation of patient's response to treatment, examination of  patient, obtaining history from patient or surrogate, ordering and performing treatments and interventions, ordering and review of laboratory studies, ordering and review of radiographic studies, pulse oximetry and re-evaluation of patient's condition.  MEDICATIONS ORDERED IN ED: Medications  lactated ringers infusion ( Intravenous New Bag/Given 07/27/23 2210)  acetaminophen (TYLENOL) tablet 650 mg (has no administration in time range)    Or  acetaminophen (TYLENOL) suppository 650 mg (has no administration in time range)  ondansetron (ZOFRAN) tablet 4 mg (has no administration in time range)    Or  ondansetron (ZOFRAN) injection 4 mg (has no administration in time range)  senna-docusate (Senokot-S) tablet 1 tablet (has no administration in time range)  insulin aspart (novoLOG) injection 0-5 Units (has no administration in time range)  insulin aspart (novoLOG) injection 0-9 Units (has no administration in time range)  potassium chloride SA (KLOR-CON M) CR tablet 40 mEq (has no administration in time range)  hydrALAZINE (APRESOLINE) injection 5 mg (has no administration in time range)  metroNIDAZOLE (FLAGYL) IVPB 500 mg (has no administration in time range)  iohexol (OMNIPAQUE) 300 MG/ML solution 80 mL (80 mLs Intravenous Contrast Given 07/27/23 2010)  sodium chloride 0.9 % bolus 1,000 mL (0 mLs Intravenous Stopped 07/27/23 2211)  lactated ringers bolus 500 mL (500 mLs Intravenous New Bag/Given 07/27/23 2211)  ceFEPIme (MAXIPIME) 2 g in sodium chloride 0.9 % 100 mL IVPB (0 g Intravenous Stopped 07/27/23 2248)  metroNIDAZOLE (FLAGYL) IVPB 500 mg (500 mg Intravenous New Bag/Given 07/27/23 2212)   IMPRESSION / MDM / ASSESSMENT AND PLAN / ED COURSE  I reviewed the triage vital signs and the nursing notes.                             The patient is on the cardiac monitor to evaluate for evidence of arrhythmia and/or significant heart rate changes. Patient's presentation is most consistent with  acute presentation with potential threat to life or bodily function. Patient is a 77 year old female with above-stated past medical history who presents complaining of right sided abdominal pain.  Differential diagnosis includes but is not limited to appendicitis, abdominal aortic aneurysm, surgical biliary disease, pancreatitis, SBO, mesenteric ischemia, serious intra-abdominal bacterial illness. Presentation also not typical of gynecologic emergencies such as TOA, Ovarian Torsion, PID. Not Ectopic. Doubt atypical ACS.  CT of the abdomen and pelvis shows a concerning mass in the left hepatic lobe causing compression of the biliary intrahepatic ducts as well as constipation  Given laboratory evidence of leukocytosis to 14 as well as transaminitis and elevated total bilirubin, patient will be empirically treated for ascending cholangitis  Tx: Cefepime, Flagyl I spoke to  Dr. Servando Snare in gastroenterology who has agreed to consult on this patient Spoke to the on-call hospitalist was graciously agreed to accept this patient onto their service for further evaluation and management.   FINAL CLINICAL IMPRESSION(S) / ED DIAGNOSES   Final diagnoses:  Right upper quadrant abdominal pain  Nausea and vomiting, unspecified vomiting type  Cholangitis  Liver mass   Rx / DC Orders   ED Discharge Orders     None      Note:  This document was prepared using Dragon voice recognition software and may include unintentional dictation errors.   Merwyn Katos, MD 07/27/23 [phone]

## 2023-07-27 NOTE — Assessment & Plan Note (Signed)
Check magnesium on admission Potassium chloride 40 mill colons p.o. one-time dose ordered Recheck BMP in the a.m.

## 2023-07-28 ENCOUNTER — Inpatient Hospital Stay: Payer: Medicare HMO

## 2023-07-28 DIAGNOSIS — E871 Hypo-osmolality and hyponatremia: Secondary | ICD-10-CM

## 2023-07-28 DIAGNOSIS — E119 Type 2 diabetes mellitus without complications: Secondary | ICD-10-CM

## 2023-07-28 DIAGNOSIS — R1011 Right upper quadrant pain: Secondary | ICD-10-CM | POA: Diagnosis not present

## 2023-07-28 DIAGNOSIS — D72829 Elevated white blood cell count, unspecified: Secondary | ICD-10-CM | POA: Diagnosis not present

## 2023-07-28 DIAGNOSIS — R7989 Other specified abnormal findings of blood chemistry: Secondary | ICD-10-CM | POA: Diagnosis not present

## 2023-07-28 DIAGNOSIS — E876 Hypokalemia: Secondary | ICD-10-CM | POA: Diagnosis not present

## 2023-07-28 DIAGNOSIS — I10 Essential (primary) hypertension: Secondary | ICD-10-CM

## 2023-07-28 DIAGNOSIS — N179 Acute kidney failure, unspecified: Secondary | ICD-10-CM | POA: Diagnosis not present

## 2023-07-28 DIAGNOSIS — R160 Hepatomegaly, not elsewhere classified: Secondary | ICD-10-CM | POA: Diagnosis not present

## 2023-07-28 LAB — LACTIC ACID, PLASMA
Lactic Acid, Venous: 1.9 mmol/L (ref 0.5–1.9)
Lactic Acid, Venous: 1.5 mmol/L (ref 0.5–1.9)

## 2023-07-28 LAB — PROTIME-INR
INR: 1 (ref 0.8–1.2)
Prothrombin Time: 13.4 s (ref 11.4–15.2)

## 2023-07-28 LAB — HEMOGLOBIN A1C
Hgb A1c MFr Bld: 7.9 % — ABNORMAL HIGH (ref 4.8–5.6)
Mean Plasma Glucose: 180.03 mg/dL

## 2023-07-28 LAB — HEPATIC FUNCTION PANEL
ALT: 249 U/L — ABNORMAL HIGH (ref 0–44)
AST: 208 U/L — ABNORMAL HIGH (ref 15–41)
Albumin: 3.2 g/dL — ABNORMAL LOW (ref 3.5–5.0)
Alkaline Phosphatase: 410 U/L — ABNORMAL HIGH (ref 38–126)
Bilirubin, Direct: 1.9 mg/dL — ABNORMAL HIGH (ref 0.0–0.2)
Indirect Bilirubin: 1.4 mg/dL — ABNORMAL HIGH (ref 0.3–0.9)
Total Bilirubin: 3.3 mg/dL — ABNORMAL HIGH (ref <1.2)
Total Protein: 5.9 g/dL — ABNORMAL LOW (ref 6.5–8.1)

## 2023-07-28 LAB — CBC WITH DIFFERENTIAL/PLATELET
Abs Immature Granulocytes: 0.06 10*3/uL (ref 0.00–0.07)
Basophils Absolute: 0 10*3/uL (ref 0.0–0.1)
Basophils Relative: 0 %
Eosinophils Absolute: 0 10*3/uL (ref 0.0–0.5)
Eosinophils Relative: 0 %
HCT: 34.8 % — ABNORMAL LOW (ref 36.0–46.0)
Hemoglobin: 12 g/dL (ref 12.0–15.0)
Immature Granulocytes: 0 %
Lymphocytes Relative: 10 %
Lymphs Abs: 1.4 10*3/uL (ref 0.7–4.0)
MCH: 30.7 pg (ref 26.0–34.0)
MCHC: 34.5 g/dL (ref 30.0–36.0)
MCV: 89 fL (ref 80.0–100.0)
Monocytes Absolute: 1.2 10*3/uL — ABNORMAL HIGH (ref 0.1–1.0)
Monocytes Relative: 8 %
Neutro Abs: 11 10*3/uL — ABNORMAL HIGH (ref 1.7–7.7)
Neutrophils Relative %: 82 %
Platelets: 289 10*3/uL (ref 150–400)
RBC: 3.91 MIL/uL (ref 3.87–5.11)
RDW: 13.9 % (ref 11.5–15.5)
WBC: 13.7 10*3/uL — ABNORMAL HIGH (ref 4.0–10.5)
nRBC: 0 % (ref 0.0–0.2)

## 2023-07-28 LAB — CBC
HCT: 33.7 % — ABNORMAL LOW (ref 36.0–46.0)
Hemoglobin: 11.9 g/dL — ABNORMAL LOW (ref 12.0–15.0)
MCH: 30.2 pg (ref 26.0–34.0)
MCHC: 35.3 g/dL (ref 30.0–36.0)
MCV: 85.5 fL (ref 80.0–100.0)
Platelets: 283 10*3/uL (ref 150–400)
RBC: 3.94 MIL/uL (ref 3.87–5.11)
RDW: 13.6 % (ref 11.5–15.5)
WBC: 13.6 10*3/uL — ABNORMAL HIGH (ref 4.0–10.5)
nRBC: 0 % (ref 0.0–0.2)

## 2023-07-28 LAB — GLUCOSE, CAPILLARY
Glucose-Capillary: 237 mg/dL — ABNORMAL HIGH (ref 70–99)
Glucose-Capillary: 196 mg/dL — ABNORMAL HIGH (ref 70–99)
Glucose-Capillary: 126 mg/dL — ABNORMAL HIGH (ref 70–99)
Glucose-Capillary: 85 mg/dL (ref 70–99)
Glucose-Capillary: 96 mg/dL (ref 70–99)

## 2023-07-28 LAB — BASIC METABOLIC PANEL
Anion gap: 11 (ref 5–15)
BUN: 33 mg/dL — ABNORMAL HIGH (ref 8–23)
CO2: 28 mmol/L (ref 22–32)
Calcium: 8.4 mg/dL — ABNORMAL LOW (ref 8.9–10.3)
Chloride: 92 mmol/L — ABNORMAL LOW (ref 98–111)
Creatinine, Ser: 0.98 mg/dL (ref 0.44–1.00)
GFR, Estimated: 59 mL/min — ABNORMAL LOW (ref >60)
Glucose, Bld: 238 mg/dL — ABNORMAL HIGH (ref 70–99)
Potassium: 3 mmol/L — ABNORMAL LOW (ref 3.5–5.1)
Sodium: 131 mmol/L — ABNORMAL LOW (ref 135–145)

## 2023-07-28 LAB — APTT: aPTT: 24 s (ref 24–36)

## 2023-07-28 LAB — LACTATE DEHYDROGENASE: LDH: 337 U/L — ABNORMAL HIGH (ref 98–192)

## 2023-07-28 LAB — HEPATITIS PANEL, ACUTE
HCV Ab: NONREACTIVE
Hep A IgM: NONREACTIVE
Hep B C IgM: NONREACTIVE
Hepatitis B Surface Ag: NONREACTIVE

## 2023-07-28 LAB — PROCALCITONIN: Procalcitonin: 0.41 ng/mL

## 2023-07-28 LAB — MAGNESIUM: Magnesium: 2.5 mg/dL — ABNORMAL HIGH (ref 1.7–2.4)

## 2023-07-28 MED ORDER — IOHEXOL 300 MG/ML  SOLN
75.0000 mL | Freq: Once | INTRAMUSCULAR | Status: AC | PRN
  Administered 2023-07-28: 75 mL via INTRAVENOUS

## 2023-07-28 MED ORDER — SODIUM CHLORIDE 0.9 % IV SOLN
2.0000 g | INTRAVENOUS | Status: DC
  Administered 2023-07-29: 2 g via INTRAVENOUS
  Filled 2023-07-28: qty 20

## 2023-07-28 MED ORDER — SODIUM CHLORIDE 0.9 % IV SOLN
2.0000 g | Freq: Two times a day (BID) | INTRAVENOUS | Status: AC
  Administered 2023-07-28 (×2): 2 g via INTRAVENOUS
  Filled 2023-07-28 (×2): qty 12.5

## 2023-07-28 MED ORDER — LORAZEPAM 2 MG/ML IJ SOLN
1.0000 mg | Freq: Once | INTRAMUSCULAR | Status: AC
  Administered 2023-07-28: 1 mg via INTRAVENOUS
  Filled 2023-07-28: qty 1

## 2023-07-28 MED ORDER — MORPHINE SULFATE (PF) 2 MG/ML IV SOLN
2.0000 mg | INTRAVENOUS | Status: DC | PRN
  Administered 2023-07-30 – 2023-07-31 (×2): 2 mg via INTRAVENOUS
  Filled 2023-07-28 (×2): qty 1

## 2023-07-28 MED ORDER — HALOPERIDOL LACTATE 5 MG/ML IJ SOLN
1.0000 mg | Freq: Once | INTRAMUSCULAR | Status: AC
  Administered 2023-07-28: 1 mg via INTRAVENOUS
  Filled 2023-07-28: qty 1

## 2023-07-28 MED ORDER — HALOPERIDOL LACTATE 5 MG/ML IJ SOLN
1.0000 mg | INTRAMUSCULAR | Status: DC | PRN
  Administered 2023-07-28: 1 mg via INTRAVENOUS
  Filled 2023-07-28: qty 1

## 2023-07-28 MED ORDER — FAMOTIDINE IN NACL 20-0.9 MG/50ML-% IV SOLN
20.0000 mg | Freq: Once | INTRAVENOUS | Status: AC
  Administered 2023-07-28: 20 mg via INTRAVENOUS
  Filled 2023-07-28 (×2): qty 50

## 2023-07-28 MED ORDER — POTASSIUM CHLORIDE 10 MEQ/100ML IV SOLN
10.0000 meq | INTRAVENOUS | Status: AC
  Administered 2023-07-28 (×5): 10 meq via INTRAVENOUS
  Filled 2023-07-28 (×5): qty 100

## 2023-07-28 MED ORDER — HALOPERIDOL LACTATE 5 MG/ML IJ SOLN
INTRAMUSCULAR | Status: AC
  Administered 2023-07-28: 1 mg via INTRAVENOUS
  Filled 2023-07-28: qty 1

## 2023-07-28 MED ORDER — POTASSIUM CHLORIDE 10 MEQ/100ML IV SOLN
10.0000 meq | INTRAVENOUS | Status: DC
  Filled 2023-07-28: qty 100

## 2023-07-28 NOTE — Consult Note (Addendum)
Hematology/Oncology Consult note [phone] [phone]      Patient Care Team: Rayetta Humphrey, MD as PCP - General (Family Medicine)   Name of the patient: [name]  [id number]  July 04, 1946   REASON FOR COSULTATION:  Unintentional weight loss, liver mass History of presenting illness-  77 y.o. female with PMH listed at below who presents to ER for evaluation of lower abdominal pain, constipation.  Patient has unintentional weight loss.  Patient was recently started on Ozempic for diabetes treatments. She denies any nausea vomiting.  Patient lives at home with husband.  She was in her usual state of health, retired and works part-time at [employer].  07/27/2023, CT scan showed 1. Constipation with large stool ball in the rectum. 2. Ill-defined focus of hypoattenuation in the left hepatic lobe superior to the gallbladder measuring 4.1 cm. Given associated intrahepatic biliary dilation this is concerning for metastasis or primary HCC causing biliary obstruction. Nonemergent hepatic protocol MRI of the abdomen with and without contrast is recommended. 3. Hyperplasia of the bilateral adrenal glands with 1.5 cm nodular thickening in the left adrenal gland. This can also be further evaluated with MRI. 4. Extensive colonic diverticulosis without diverticulitis.  Oncology was consulted for further evaluation management. Patient sister and daughter were at the bedside.  Patient denies alcohol use routinely.  She smokes a few cigarettes daily.  Patient has a remote history of uterine cancer status post surgery in 40s..  Denies any radiation or chemotherapy No recent colonoscopy   Allergies  Allergen Reactions   Ezetimibe Other (See Comments)    heartburn   Simvastatin Other (See Comments)    Acid reflux symptoms   Niacin Other (See Comments)    Heartburn   Pravastatin Other (See Comments)    Acid reflux    Patient Active Problem List   Diagnosis  Date Noted   Hypokalemia 07/27/2023   Elevated LFTs 07/27/2023   Essential hypertension 07/27/2023   Hyperlipidemia 07/27/2023   Diabetes mellitus type 2, noninsulin dependent (HCC) 07/27/2023   AKI (acute kidney injury) (HCC) 07/27/2023   Liver mass 07/27/2023   Leukocytosis 07/27/2023     Past Medical History:  Diagnosis Date   Carcinoma of vaginal vault (HCC)    s/p Hysterectomy with vaginal resection   Diabetes mellitus type 2, controlled (HCC)    Essential hypertension    H/O: CVA (cerebrovascular accident) 04/21/2012   inferior posterior limb of the internal capsule, also tiny punctate acute infarct in the posterior aspect of the right parietal lobe   Hyperlipidemia    Status post cataract surgery      Past Surgical History:  Procedure Laterality Date   ABDOMINAL HYSTERECTOMY     vaginal resection   BREAST BIOPSY Left    bx/clip-neg    Social History   Socioeconomic History   Marital status: Married    Spouse name: Not on file   Number of children: Not on file   Years of education: Not on file   Highest education level: Not on file  Occupational History   Not on file  Tobacco Use   Smoking status: Every Day    Current packs/day: 0.25    Types: Cigarettes   Smokeless tobacco: Never  Vaping Use   Vaping status: Never Used  Substance and Sexual Activity   Alcohol use: No   Drug use: No   Sexual activity: Not Currently  Other Topics Concern   Not on file  Social History Narrative  She lives at home with her husband. She  smokes about 1/2 pack per day, used to smoke more than 2 packs in the past. She started smoking when she was 77 years old. No history of any alcohol use or drug abuse. She currently works at [employer] in [employer]. She used to work in [employer] in the past.     Social Determinants of Health   Financial Resource Strain: Medium Risk (03/17/2019)   Received from [facility]   Overall Financial Resource Strain (CARDIA)     Difficulty of Paying Living Expenses: Somewhat hard  Food Insecurity: No Food Insecurity (07/28/2023)   Hunger Vital Sign    Worried About Running Out of Food in the Last Year: Never true    Ran Out of Food in the Last Year: Never true  Transportation Needs: No Transportation Needs (07/28/2023)   PRAPARE - Administrator, Civil Service (Medical): No    Lack of Transportation (Non-Medical): No  Physical Activity: Insufficiently Active (03/17/2019)   Received from [facility]   Exercise Vital Sign    Days of Exercise per Week: 3 days    Minutes of Exercise per Session: 30 min  Stress: Not on file  Social Connections: Unknown (03/17/2019)   Received from [facility]   Social Connection and Isolation Panel [NHANES]    Frequency of Communication with Friends and Family: More than three times a week    Frequency of Social Gatherings with Friends and Family: More than three times a week    Attends Religious Services: Not on file    Active Member of Clubs or Organizations: Not on file    Attends Banker Meetings: Not on file    Marital Status: Not on file  Intimate Partner Violence: Not At Risk (07/28/2023)   Humiliation, Afraid, Rape, and Kick questionnaire    Fear of Current or Ex-Partner: No    Emotionally Abused: No    Physically Abused: No    Sexually Abused: No     Family History  Problem Relation Age of Onset   Peripheral vascular disease Father    Heart disease Mother    Breast cancer Neg Hx      Current Facility-Administered Medications:    acetaminophen (TYLENOL) tablet 650 mg, 650 mg, Oral, Q6H PRN **OR** acetaminophen (TYLENOL) suppository 650 mg, 650 mg, Rectal, Q6H PRN, Cox, Amy N, DO   ceFEPIme (MAXIPIME) 2 g in sodium chloride 0.9 % 100 mL IVPB, 2 g, Intravenous, Q12H, Delfino Lovett, MD, Last Rate: 200 mL/hr at 07/28/23 0923, 2 g at 07/28/23 0923   [START ON 07/29/2023] cefTRIAXone (ROCEPHIN) 2 g in sodium  chloride 0.9 % 100 mL IVPB, 2 g, Intravenous, Q24H, Sherryll Burger, Vipul, MD   haloperidol lactate (HALDOL) injection 1 mg, 1 mg, Intravenous, Q4H PRN, Delfino Lovett, MD, 1 mg at 07/28/23 1711   hydrALAZINE (APRESOLINE) injection 10 mg, 10 mg, Intravenous, Q4H PRN, Cox, Amy N, DO   insulin aspart (novoLOG) injection 0-5 Units, 0-5 Units, Subcutaneous, QHS, Cox, Amy N, DO, 2 Units at 07/28/23 0131   insulin aspart (novoLOG) injection 0-9 Units, 0-9 Units, Subcutaneous, TID WC, Cox, Amy N, DO, 1 Units at 07/28/23 1140   metroNIDAZOLE (FLAGYL) IVPB 500 mg, 500 mg, Intravenous, Q12H, Cox, Amy N, DO, Last Rate: 100 mL/hr at 07/28/23 1009, 500 mg at 07/28/23 1009   morphine (PF) 2 MG/ML injection 2 mg, 2 mg, Intravenous, Q4H PRN, Cox, Amy N,  DO   ondansetron (ZOFRAN) tablet 4 mg, 4 mg, Oral, Q6H PRN **OR** ondansetron (ZOFRAN) injection 4 mg, 4 mg, Intravenous, Q6H PRN, Cox, Amy N, DO   senna-docusate (Senokot-S) tablet 1 tablet, 1 tablet, Oral, QHS PRN, Cox, Amy N, DO  Review of Systems  Constitutional:  Positive for unexpected weight change. Negative for appetite change, chills, fatigue and fever.  HENT:   Negative for hearing loss and voice change.   Eyes:  Negative for eye problems.  Respiratory:  Negative for chest tightness and cough.   Cardiovascular:  Negative for chest pain.  Gastrointestinal:  Positive for abdominal pain and constipation. Negative for abdominal distention and blood in stool.  Endocrine: Negative for hot flashes.  Genitourinary:  Negative for difficulty urinating and frequency.   Musculoskeletal:  Negative for arthralgias.  Skin:  Negative for itching and rash.  Neurological:  Negative for extremity weakness.  Hematological:  Negative for adenopathy.  Psychiatric/Behavioral:  Negative for confusion.     PHYSICAL EXAM Vitals:   07/27/23 2300 07/28/23 0000 07/28/23 0108 07/28/23 0844  BP:  (!) 169/68 (!) 153/73 (!) 183/60  Pulse: 63 71 99 71  Resp:   18 16  Temp:   98.5 F  (36.9 C) 98.6 F (37 C)  TempSrc:   Oral Oral  SpO2: 99% 96% 97% 96%  Weight:      Height:       Physical Exam Constitutional:      General: She is not in acute distress.    Appearance: She is not diaphoretic.  HENT:     Head: Normocephalic and atraumatic.     Mouth/Throat:     Pharynx: No oropharyngeal exudate.  Cardiovascular:     Rate and Rhythm: Normal rate and regular rhythm.  Pulmonary:     Effort: Pulmonary effort is normal. No respiratory distress.     Breath sounds: No wheezing.  Abdominal:     General: Bowel sounds are normal. There is no distension.     Palpations: Abdomen is soft.  Musculoskeletal:        General: Normal range of motion.     Cervical back: Normal range of motion and neck supple.  Skin:    General: Skin is warm and dry.     Findings: No erythema.  Neurological:     Mental Status: She is alert and oriented to person, place, and time. Mental status is at baseline.     Cranial Nerves: No cranial nerve deficit.     Motor: No abnormal muscle tone.  Psychiatric:        Mood and Affect: Mood and affect normal.       LABORATORY STUDIES    Latest Ref Rng & Units 07/28/2023    1:58 AM 07/27/2023    7:20 PM 04/22/2012    4:22 AM  CBC  WBC 4.0 - 10.5 K/uL 4.0 - 10.5 K/uL 13.6    13.7  14.4  9.8   Hemoglobin 12.0 - 15.0 g/dL 46.9 - 62.9 g/dL 52.8    41.3  24.4  01.0   Hematocrit 36.0 - 46.0 % 36.0 - 46.0 % 33.7    34.8  34.7  38.9   Platelets 150 - 400 K/uL 150 - 400 K/uL 283    289  308  196       Latest Ref Rng & Units 07/28/2023    1:58 AM 07/27/2023    7:20 PM 04/28/2017    7:50 PM  CMP  Glucose 70 -  99 mg/dL 119  147  829   BUN 8 - 23 mg/dL 33  38  22   Creatinine 0.44 - 1.00 mg/dL 5.62  1.30  8.65   Sodium 135 - 145 mmol/L 131  129  133   Potassium 3.5 - 5.1 mmol/L 3.0  3.2  4.0   Chloride 98 - 111 mmol/L 92  87  100   CO2 22 - 32 mmol/L 28  28  25    Calcium 8.9 - 10.3 mg/dL 8.4  8.9  9.3   Total Protein 6.5 - 8.1 g/dL 5.9   6.7  7.1   Total Bilirubin <1.2 mg/dL 3.3  2.1  0.2   Alkaline Phos 38 - 126 U/L 410  446  75   AST 15 - 41 U/L 208  162  22   ALT 0 - 44 U/L 249  269  17      RADIOGRAPHIC STUDIES: I have personally reviewed the radiological images as listed and agreed with the findings in the report. CT ABDOMEN PELVIS W CONTRAST  Result Date: 07/27/2023 CLINICAL DATA:  Right lower quadrant abdominal pain. No bowel movement in 3 days EXAM: CT ABDOMEN AND PELVIS WITH CONTRAST TECHNIQUE: Multidetector CT imaging of the abdomen and pelvis was performed using the standard protocol following bolus administration of intravenous contrast. RADIATION DOSE REDUCTION: This exam was performed according to the departmental dose-optimization program which includes automated exposure control, adjustment of the mA and/or kV according to patient size and/or use of iterative reconstruction technique. CONTRAST:  80mL OMNIPAQUE IOHEXOL 300 MG/ML  SOLN COMPARISON:  Same day abdominal radiograph FINDINGS: Lower chest: No acute abnormality. Hepatobiliary: Ill-defined focus of hypoattenuation in the left hepatic lobe superior to the gallbladder. This measures 4.1 x 2.4 cm. Normal gallbladder. The intrahepatic bile ducts are moderately dilated. No dilation of the common bile duct. No radiopaque stone. Pancreas: Unremarkable. No pancreatic ductal dilatation or surrounding inflammatory changes. Spleen: Unremarkable. Adrenals/Urinary Tract: Bilateral adrenal gland hyperplasia. There is nodular thickening in the left adrenal gland measuring 1.5 cm mild enhancement (Hounsfield units 73). Low-attenuation lesions in the kidneys are statistically likely to represent cysts. No follow-up is required. No urinary calculi or hydronephrosis. Stomach/Bowel: Large stool ball in the rectum. Moderate colonic stool load. Extensive colonic diverticulosis without evidence of diverticulitis. Stomach is within normal limits. The appendix is normal.  Vascular/Lymphatic: Aortic atherosclerosis. Portal vein is patent. No enlarged abdominal or pelvic lymph nodes. Reproductive: Status post hysterectomy. No adnexal masses. Other: No free intraperitoneal fluid or air. Musculoskeletal: No acute fracture or destructive osseous lesion. IMPRESSION: 1. Constipation with large stool ball in the rectum. 2. Ill-defined focus of hypoattenuation in the left hepatic lobe superior to the gallbladder measuring 4.1 cm. Given associated intrahepatic biliary dilation this is concerning for metastasis or primary HCC causing biliary obstruction. Nonemergent hepatic protocol MRI of the abdomen with and without contrast is recommended. 3. Hyperplasia of the bilateral adrenal glands with 1.5 cm nodular thickening in the left adrenal gland. This can also be further evaluated with MRI. 4. Extensive colonic diverticulosis without diverticulitis. Aortic Atherosclerosis (ICD10-I70.0). Electronically Signed   By: Minerva Fester M.D.   On: 07/27/2023 21:39   DG Abd 1 View  Result Date: 07/27/2023 CLINICAL DATA:  Abdomen pain EXAM: ABDOMEN - 1 VIEW COMPARISON:  None Available. FINDINGS: Nonobstructed gas pattern. Large stool in the colon and rectum. Numerous pelvic surgical clips. Pelvic phleboliths. IMPRESSION: Nonobstructed gas pattern with large stool in the colon and rectum suggesting constipation.  Electronically Signed   By: Jasmine Pang M.D.   On: 07/27/2023 17:02     Assessment and plan-   # 4 cm liver mass, nodular thickening of the left adrenal gland Recommend MRI MRCP of abdomen for further evaluation. Check CEA, CA 19-9, AFP, LDH She will need biopsy.  Tissue diagnosis.  I will obtain CT chest with contrast Depending on MRI/MRCP and CT chest results, we will decide best biopsy site.  # Abnormal liver function,?  Secondary to intrahepatic biliary dilatation suspect obstruction. GI recommendation was reviewed. Check PT, PTT, hepatitis panel.  # Leukocytosis ?   Cholangitis/ sepsis-blood culture negative x 2.  Continue broad-spectrum antibiotics.  # Hyponatremia.  Pending above workup.  Thank you for allowing me to participate in the care of this patient.   Rickard Patience, MD, PhD Hematology Oncology 07/28/2023

## 2023-07-28 NOTE — Progress Notes (Signed)
Transition of Care [facility]) - Inpatient Brief Assessment   Patient Details  Name: [name] MRN: [id number] Date of Birth: 1946-04-13  [facility] CM/SW Contact:    Truddie Hidden, RN Phone Number: 07/28/2023, 4:27 PM   Clinical Narrative: TOC continuing to follow patient's progress throughout discharge planning.   Transition of Care Asessment: Insurance and Status: Insurance coverage has been reviewed Patient has primary care physician: Yes Home environment has been reviewed: home Prior level of function:: independent Prior/Current Home Services: No current home services Social Determinants of Health Reivew: SDOH reviewed no interventions necessary Readmission risk has been reviewed: Yes Transition of care needs: no transition of care needs at this time

## 2023-07-28 NOTE — Plan of Care (Signed)
  Problem: Education: Goal: Ability to describe self-care measures that may prevent or decrease complications (Diabetes Survival Skills Education) will improve 07/28/2023 1406 by Roxan Hockey, RN Outcome: Progressing 07/28/2023 1406 by Roxan Hockey, RN Outcome: Progressing Goal: Individualized Educational Video(s) 07/28/2023 1406 by Roxan Hockey, RN Outcome: Progressing 07/28/2023 1406 by Roxan Hockey, RN Outcome: Progressing   Problem: Education: Goal: Individualized Educational Video(s) 07/28/2023 1406 by Roxan Hockey, RN Outcome: Progressing 07/28/2023 1406 by Roxan Hockey, RN Outcome: Progressing   Problem: Coping: Goal: Ability to adjust to condition or change in health will improve 07/28/2023 1406 by Roxan Hockey, RN Outcome: Progressing 07/28/2023 1406 by Roxan Hockey, RN Outcome: Progressing

## 2023-07-28 NOTE — Consult Note (Signed)
Kerry Minium, MD [facility]  [address] [place], [place] [address] Phone: [phone] Fax : [phone]  Consultation  Referring Provider:     Dr. Elige Radon Primary Care Physician:  Rayetta Humphrey, MD Primary Gastroenterologist: Gentry Fitz         Reason for Consultation:     Liver mass  Date of Admission:  07/27/2023 Date of Consultation:  07/28/2023         HPI:   [name] is a 77 y.o. female with a history of having high blood pressure type 2 diabetes a CVA and had gone to her primary care provider due to constipation and inability to move her bowels for 3 days with right lower quadrant abdominal pain.  The patient had an x-ray that was concerning for stool impaction and was sent to the emergency department for possible appendicitis.  Patient underwent a CT scan of the abdomen that showed:  IMPRESSION: 1. Constipation with large stool ball in the rectum. 2. Ill-defined focus of hypoattenuation in the left hepatic lobe superior to the gallbladder measuring 4.1 cm. Given associated intrahepatic biliary dilation this is concerning for metastasis or primary HCC causing biliary obstruction. Nonemergent hepatic protocol MRI of the abdomen with and without contrast is recommended. 3. Hyperplasia of the bilateral adrenal glands with 1.5 cm nodular thickening in the left adrenal gland. This can also be further evaluated with MRI. 4. Extensive colonic diverticulosis without diverticulitis.  Aortic Atherosclerosis  The patient's family reports that the patient's last colonoscopy was in 2014 at [facility].  Patient had liver enzymes checked while in the hospital that showed the patient's liver enzymes to be significantly elevated with a bilirubin of 2.1 that went up to 3.3 today.  The rest of the labs showed:  Component     Latest Ref Rng 07/27/2023 07/28/2023  Albumin     3.5 - 5.0 g/dL 3.4 (L)  3.2 (L)   AST     15 - 41 U/L 162 (H)  208 (H)   ALT     0 -  44 U/L 269 (H)  249 (H)   Alkaline Phosphatase     38 - 126 U/L 446 (H)  410 (H)   Total Bilirubin     <1.2 mg/dL 2.1 (H)  3.3 (H)    There is been a report of a pound weight loss recently but the patient had been started on Ozempic for her diabetes.  The patient also smokes cigarettes and had been a previous 2 pack a day smoker.  There is no report of any alcohol abuse.  The patient has been evaluated by hematology/oncology for the liver mass found.  Past Medical History:  Diagnosis Date   Carcinoma of vaginal vault (HCC)    s/p Hysterectomy with vaginal resection   Diabetes mellitus type 2, controlled (HCC)    Essential hypertension    H/O: CVA (cerebrovascular accident) 04/21/2012   inferior posterior limb of the internal capsule, also tiny punctate acute infarct in the posterior aspect of the right parietal lobe   Hyperlipidemia    Status post cataract surgery     Past Surgical History:  Procedure Laterality Date   ABDOMINAL HYSTERECTOMY     vaginal resection   BREAST BIOPSY Left    bx/clip-neg    Prior to Admission medications   Medication Sig Start Date End Date Taking? Authorizing Provider  aspirin 81 MG chewable tablet Chew by mouth.    [provider]  baclofen 5 MG TABS Take 5 mg by mouth at bedtime as needed for muscle spasms. 09/25/21   White, Elita Boone, NP  lisinopril (ZESTRIL) 5 MG tablet Take 1 tablet by mouth 2 (two) times daily. 09/21/19   [provider]  metFORMIN (GLUCOPHAGE) 500 MG tablet Take by mouth 2 (two) times daily with a meal.    [provider]  naproxen (NAPROSYN) 500 MG tablet Take 1 tablet (500 mg total) by mouth 2 (two) times daily. 09/25/21   Valinda Hoar, NP  Semaglutide,0.25 or 0.5MG /DOS, 2 MG/3ML SOPN Inject into the skin. 04/07/23   [provider]    Family History  Problem Relation Age of Onset   Peripheral vascular disease Father    Heart disease Mother    Breast cancer Neg Hx      Social  History   Tobacco Use   Smoking status: Every Day    Current packs/day: 0.25    Types: Cigarettes   Smokeless tobacco: Never  Vaping Use   Vaping status: Never Used  Substance Use Topics   Alcohol use: No   Drug use: No    Allergies as of 07/27/2023 - Review Complete 07/27/2023  Allergen Reaction Noted   Ezetimibe Other (See Comments) 03/17/2017   Simvastatin Other (See Comments) 11/14/2013   Niacin Other (See Comments) 06/19/2017   Pravastatin Other (See Comments) 12/06/2015    Review of Systems:    All systems reviewed and negative except where noted in HPI.   Physical Exam:  Vital signs in last 24 hours: Temp:  [98.1 F (36.7 C)-98.6 F (37 C)] 98.6 F (37 C) (11/26 0844) Pulse Rate:  [62-99] 71 (11/26 0844) Resp:  [16-20] 16 (11/26 0844) BP: (132-193)/(60-80) 183/60 (11/26 0844) SpO2:  [95 %-100 %] 96 % (11/26 0844) Weight:  [56.7 kg] 56.7 kg (11/25 1851)   General:   Pleasant, cooperative in NAD Head:  Normocephalic and atraumatic. Eyes:   No icterus.   Conjunctiva pink. PERRLA. Ears:  Normal auditory acuity. Neck:  Supple; no masses or thyroidomegaly Lungs: Respirations even and unlabored. Lungs clear to auscultation bilaterally.   No wheezes, crackles, or rhonchi.  Heart:  Regular rate and rhythm;  Without murmur, clicks, rubs or gallops Abdomen:  Soft, nondistended, nontender. Normal bowel sounds. No appreciable masses or hepatomegaly.  No rebound or guarding.  Rectal:  Not performed. Msk:  Symmetrical without gross deformities.   Extremities:  Without edema, cyanosis or clubbing. Neurologic: Drowsy;  grossly normal neurologically. Skin:  Intact without significant lesions or rashes. Cervical Nodes:  No significant cervical adenopathy. Psych:  Alert and cooperative. Normal affect.  LAB RESULTS: Recent Labs    07/27/23 1920 07/28/23 0158  WBC 14.4* 13.7*  13.6*  HGB 11.9* 12.0  11.9*  HCT 34.7* 34.8*  33.7*  PLT 308 289  283   BMET Recent  Labs    07/27/23 1920 07/28/23 0158  NA 129* 131*  K 3.2* 3.0*  CL 87* 92*  CO2 28 28  GLUCOSE 265* 238*  BUN 38* 33*  CREATININE 1.27* 0.98  CALCIUM 8.9 8.4*   LFT Recent Labs    07/28/23 0158  PROT 5.9*  ALBUMIN 3.2*  AST 208*  ALT 249*  ALKPHOS 410*  BILITOT 3.3*  BILIDIR 1.9*  IBILI 1.4*   PT/INR Recent Labs    07/28/23 1204  LABPROT 13.4  INR 1.0    STUDIES: CT ABDOMEN PELVIS W CONTRAST  Result Date: 07/27/2023 CLINICAL DATA:  Right lower quadrant abdominal pain. No bowel movement in 3 days EXAM: CT ABDOMEN AND PELVIS WITH CONTRAST TECHNIQUE: Multidetector CT imaging of the abdomen and pelvis was performed using the standard protocol following bolus administration of intravenous contrast. RADIATION DOSE REDUCTION: This exam was performed according to the departmental dose-optimization program which includes automated exposure control, adjustment of the mA and/or kV according to patient size and/or use of iterative reconstruction technique. CONTRAST:  80mL OMNIPAQUE IOHEXOL 300 MG/ML  SOLN COMPARISON:  Same day abdominal radiograph FINDINGS: Lower chest: No acute abnormality. Hepatobiliary: Ill-defined focus of hypoattenuation in the left hepatic lobe superior to the gallbladder. This measures 4.1 x 2.4 cm. Normal gallbladder. The intrahepatic bile ducts are moderately dilated. No dilation of the common bile duct. No radiopaque stone. Pancreas: Unremarkable. No pancreatic ductal dilatation or surrounding inflammatory changes. Spleen: Unremarkable. Adrenals/Urinary Tract: Bilateral adrenal gland hyperplasia. There is nodular thickening in the left adrenal gland measuring 1.5 cm mild enhancement (Hounsfield units 73). Low-attenuation lesions in the kidneys are statistically likely to represent cysts. No follow-up is required. No urinary calculi or hydronephrosis. Stomach/Bowel: Large stool ball in the rectum. Moderate colonic stool load. Extensive colonic diverticulosis  without evidence of diverticulitis. Stomach is within normal limits. The appendix is normal. Vascular/Lymphatic: Aortic atherosclerosis. Portal vein is patent. No enlarged abdominal or pelvic lymph nodes. Reproductive: Status post hysterectomy. No adnexal masses. Other: No free intraperitoneal fluid or air. Musculoskeletal: No acute fracture or destructive osseous lesion. IMPRESSION: 1. Constipation with large stool ball in the rectum. 2. Ill-defined focus of hypoattenuation in the left hepatic lobe superior to the gallbladder measuring 4.1 cm. Given associated intrahepatic biliary dilation this is concerning for metastasis or primary HCC causing biliary obstruction. Nonemergent hepatic protocol MRI of the abdomen with and without contrast is recommended. 3. Hyperplasia of the bilateral adrenal glands with 1.5 cm nodular thickening in the left adrenal gland. This can also be further evaluated with MRI. 4. Extensive colonic diverticulosis without diverticulitis. Aortic Atherosclerosis (ICD10-I70.0). Electronically Signed   By: Minerva Fester M.D.   On: 07/27/2023 21:39   DG Abd 1 View  Result Date: 07/27/2023 CLINICAL DATA:  Abdomen pain EXAM: ABDOMEN - 1 VIEW COMPARISON:  None Available. FINDINGS: Nonobstructed gas pattern. Large stool in the colon and rectum. Numerous pelvic surgical clips. Pelvic phleboliths. IMPRESSION: Nonobstructed gas pattern with large stool in the colon and rectum suggesting constipation. Electronically Signed   By: Jasmine Pang M.D.   On: 07/27/2023 17:02      Impression / Plan:   Assessment: Principal Problem:   Elevated LFTs Active Problems:   Hypokalemia   Essential hypertension   Hyperlipidemia   Diabetes mellitus type 2, noninsulin dependent (HCC)   AKI (acute kidney injury) (HCC)   Liver mass   Leukocytosis   Caffie Chelce Notch is a 77 y.o. y/o female with with a liver mass and abnormal liver enzymes with intrahepatic ductal dilatation with a normal  common bile duct.  I am now being asked to see the patient for possible ERCP.  I have requested that the patient undergo an MRI/MRCP to look for where the narrowing may be to see if it is an area that can be stented.  The patient was sedated for the MRCP due to claustrophobia but still had an episode of being combative in the MRI and the procedure was canceled.  Plan:  The patient will need imaging of the biliary tree to see if the strictures causing the intrahepatic ductal dilatation with  a normal CBD could be treated with stenting.  The patient is already being followed by oncology and a final diagnosis of the liver lesion needs to be ascertained to guide treatment.  If the patient cannot get imaging of the biliary tree then a interventional radiology intervention with a percutaneous transhepatic drain may be needed.  Thank you for involving me in the care of this patient.      LOS: 1 day   Kerry Minium, MD, [facility] 07/28/2023, 2:52 PM,  Pager [phone] 7am-5pm  Check AMION for 5pm -7am coverage and on weekends   Note: This dictation was prepared with Dragon dictation along with smaller phrase technology. Any transcriptional errors that result from this process are unintentional.

## 2023-07-28 NOTE — Progress Notes (Signed)
Pharmacy Antibiotic Note  [name] is a 77 y.o. female admitted on 07/27/2023 with intra-abdominal infections.  Pharmacy has been consulted for Cefepime dosing for 7 days.  Plan: Cefepime 2 gm q12hr per indication & renal fxn.  Pharmacy will continue to follow and will adjust abx dosing whenever warranted.  Temp (24hrs), Avg:98.2 F (36.8 C), Min:98.1 F (36.7 C), Max:98.3 F (36.8 C)   Recent Labs  Lab 07/27/23 1920  WBC 14.4*  CREATININE 1.27*    Estimated Creatinine Clearance: 30.7 mL/min (A) (by C-G formula based on SCr of 1.27 mg/dL (H)).    Allergies  Allergen Reactions   Ezetimibe Other (See Comments)    heartburn   Simvastatin Other (See Comments)    Acid reflux symptoms   Niacin Other (See Comments)    Heartburn   Pravastatin Other (See Comments)    Acid reflux    Antimicrobials this admission: 11/25 Cefepime >> x 7 days 11/25 Flagyl >> x 7 days  Microbiology results: 11/25 BCx: Pending  Thank you for allowing pharmacy to be a part of this patient's care.  Otelia Sergeant, PharmD, [facility] 07/28/2023 12:01 AM

## 2023-07-28 NOTE — Consult Note (Signed)
PHARMACY CONSULT NOTE - ELECTROLYTES  Pharmacy Consult for Electrolyte Monitoring and Replacement   Recent Labs: Potassium (mmol/L)  Date Value  07/28/2023 3.0 (L)  04/22/2012 3.7   Magnesium (mg/dL)  Date Value  29/52/8413 2.5 (H)  04/22/2012 1.9   Calcium (mg/dL)  Date Value  24/40/1027 8.4 (L)   Calcium, Total (mg/dL)  Date Value  25/36/6440 8.7   Albumin (g/dL)  Date Value  34/74/2595 3.2 (L)  04/21/2012 3.8   Sodium (mmol/L)  Date Value  07/28/2023 131 (L)  04/22/2012 144   Height: 5\' 3"  (160 cm) Weight: 56.7 kg (125 lb) IBW/kg (Calculated) : 52.4 Estimated Creatinine Clearance: 39.8 mL/min (by C-G formula based on SCr of 0.98 mg/dL).  Assessment  [name] is a 77 y.o. female presenting with abdominal pain with subsequent findings concerning for liver mass and malignant process. PMH significant for hypertension, non-insulin-dependent diabetes mellitus, hyperlipidemia, history of tobacco use. Pharmacy has been consulted to monitor and replace electrolytes.  Diet: NPO MIVF: LR @ 150 mL/hr Pertinent medications: N/A  Goal of Therapy: Electrolytes within normal limits  Plan:  Hyponatremia improving w/ fluids K 3, will give Kcl 10 mEq IV x 5 doses Follow-up electrolytes tomorrow AM  Thank you for allowing pharmacy to be a part of this patient's care.  Tressie Ellis 07/28/2023 10:00 AM

## 2023-07-28 NOTE — Inpatient Diabetes Management (Signed)
Inpatient Diabetes Program Recommendations  AACE/ADA: New Consensus Statement on Inpatient Glycemic Control (2015)  Target Ranges:  Prepandial:   less than 140 mg/dL      Peak postprandial:   less than 180 mg/dL (1-2 hours)      Critically ill patients:  140 - 180 mg/dL   Lab Results  Component Value Date   GLUCAP 196 (H) 07/28/2023   HGBA1C 7.9 (H) 07/28/2023    Review of Glycemic Control  Latest Reference Range & Units 07/28/23 01:24 07/28/23 08:42  Glucose-Capillary 70 - 99 mg/dL 409 (H) 811 (H)   Diabetes history: DM2 Outpatient Diabetes medications:  Metformin 500 mg bid Semaglutide weekly Current orders for Inpatient glycemic control:  Novolog 0-9 units tid with meals and HS  Inpatient Diabetes Program Recommendations:    May consider adding Semglee 8 units daily while in the hospital.   Thanks,  Beryl Meager, RN, BC-ADM Inpatient Diabetes Coordinator Pager [phone]  (8a-5p)

## 2023-07-28 NOTE — Progress Notes (Signed)
We brought pt down around 2pm for MRCP and she was unable to keep equipment on for test. She would not hold still, She was trying to get off table. RN gave the max amount of meds before attempting the exam.

## 2023-07-28 NOTE — Progress Notes (Signed)
PROGRESS NOTE    [name]  WUJ:[id number] DOB: August 01, 1946 DOA: 07/27/2023 PCP: Rayetta Humphrey, MD    Brief Narrative:   77 year old female with history of hypertension, non-insulin-dependent diabetes mellitus, hyperlipidemia, history of tobacco use, who presents to the emergency department for chief concerns of abdominal pain   11/26: GI, Onco, Palliative care c/s, MRI abd, Tumor markers   Assessment & Plan:   Principal Problem:   Elevated LFTs Active Problems:   Hypokalemia   Essential hypertension   Hyperlipidemia   Diabetes mellitus type 2, noninsulin dependent (HCC)   AKI (acute kidney injury) (HCC)   Liver mass   Leukocytosis   * Elevated LFTs Suspect secondary to cholangitis complicated by possible metastasis versus hepatocellular carcinoma causing dilatation Blood cultures x 2 neg thus far GI seen - requested MRI - MRI ordered but patient claustrophobia and despite meds - couldn't complete it. - may need IR to do percutaneous transhepatic drain if unable to evaluate biliary tree per GI - Recheck liver enzymes in the a.m.   Leukocytosis Etiology workup in progress Differentials include sepsis although doubt it  Blood cultures x 2 are neg thus far Continue broad-spectrum cefepime with metronidazole 500 mg IV twice daily Recheck CBC in a.m.   Liver mass Differentials include metastasis versus primary hepatocellular carcinoma causing biliary obstruction  consult medical oncology - Dr Cathie Hoops aware Tumor markers ordered   AKI (acute kidney injury) (HCC) Resolved with hydration   Diabetes mellitus type 2, noninsulin dependent (HCC) Insulin SSI with at bedtime coverage ordered Home metformin and semaglutide not resumed on admission   Hyperlipidemia Home atorvastatin 40 mg daily not resumed on admission due to elevated LFTs   Essential hypertension Uncontrolled, suspect secondary to agitation Home lisinopril 5 daily not resumed on admission  due to acute kidney injury Hydralazine 10 mg IV every 4 hours as needed for SBP greater than 170, 5 days of coverage ordered   Hypokalemia Replete and recheck  Acute delirium/agitation Haldol prn  GOC Palliative care c/s, overall poor prognosis   DVT prophylaxis: SCD Place TED hose Start: 07/27/23 2245     Code Status: (Full code) Family Communication: (NO "discussed with patient") Disposition Plan: possible D/C in 2-3 days depending on GI, Onco w/up   Consultants:  GI Onco    Antimicrobials:  Cefepime (switch to Rocephin on 11/27)  Flagyl   Subjective:  Shares that she has lost about 50 lbs over a yr (unintentional), abd pain 4/10  Objective: Vitals:   07/27/23 2300 07/28/23 0000 07/28/23 0108 07/28/23 0844  BP:  (!) 169/68 (!) 153/73 (!) 183/60  Pulse: 63 71 99 71  Resp:   18 16  Temp:   98.5 F (36.9 C) 98.6 F (37 C)  TempSrc:   Oral Oral  SpO2: 99% 96% 97% 96%  Weight:      Height:        Intake/Output Summary (Last 24 hours) at 07/28/2023 1857 Last data filed at 07/28/2023 0536 Gross per 24 hour  Intake 2084.8 ml  Output --  Net 2084.8 ml   Filed Weights   07/27/23 1851  Weight: 56.7 kg    Examination:  General exam: Appears calm and comfortable, frail, thin Respiratory system: Clear to auscultation. Respiratory effort normal. Cardiovascular system: S1 & S2 heard, RRR. No JVD, murmurs, rubs, gallops or clicks. No pedal edema. Gastrointestinal system: Abdomen is soft, mild generalized tenderness, no guarding/rigidity Central nervous system: Alert and oriented. No focal neurological deficits. Extremities: Symmetric  5 x 5 power. Skin: No rashes, lesions or ulcers Psychiatry: Judgement and insight appear normal. Mood & affect appropriate.     Data Reviewed: I have personally reviewed following labs and imaging studies  CBC: Recent Labs  Lab 07/27/23 1920 07/28/23 0158  WBC 14.4* 13.7*  13.6*  NEUTROABS  --  11.0*  HGB 11.9* 12.0   11.9*  HCT 34.7* 34.8*  33.7*  MCV 89.2 89.0  85.5  PLT 308 289  283   Basic Metabolic Panel: Recent Labs  Lab 07/27/23 1920 07/28/23 0158  NA 129* 131*  K 3.2* 3.0*  CL 87* 92*  CO2 28 28  GLUCOSE 265* 238*  BUN 38* 33*  CREATININE 1.27* 0.98  CALCIUM 8.9 8.4*  MG 2.5*  --    GFR: Estimated Creatinine Clearance: 39.8 mL/min (by C-G formula based on SCr of 0.98 mg/dL). Liver Function Tests: Recent Labs  Lab 07/27/23 1920 07/28/23 0158  AST 162* 208*  ALT 269* 249*  ALKPHOS 446* 410*  BILITOT 2.1* 3.3*  PROT 6.7 5.9*  ALBUMIN 3.4* 3.2*   Recent Labs  Lab 07/27/23 1920  LIPASE 60*   No results for input(s): "AMMONIA" in the last 168 hours. Coagulation Profile: Recent Labs  Lab 07/28/23 1204  INR 1.0   Cardiac Enzymes: No results for input(s): "CKTOTAL", "CKMB", "CKMBINDEX", "TROPONINI" in the last 168 hours. BNP (last 3 results) No results for input(s): "PROBNP" in the last 8760 hours. HbA1C: Recent Labs    07/28/23 0158  HGBA1C 7.9*   CBG: Recent Labs  Lab 07/28/23 0124 07/28/23 0842 07/28/23 1115 07/28/23 1622  GLUCAP 237* 196* 126* 85   Lipid Profile: No results for input(s): "CHOL", "HDL", "LDLCALC", "TRIG", "CHOLHDL", "LDLDIRECT" in the last 72 hours. Thyroid Function Tests: No results for input(s): "TSH", "T4TOTAL", "FREET4", "T3FREE", "THYROIDAB" in the last 72 hours. Anemia Panel: No results for input(s): "VITAMINB12", "FOLATE", "FERRITIN", "TIBC", "IRON", "RETICCTPCT" in the last 72 hours. Sepsis Labs: Recent Labs  Lab 07/27/23 1920 07/27/23 2203 07/28/23 0158  PROCALCITON 0.41  --   --   LATICACIDVEN  --  1.9 1.5    Recent Results (from the past 240 hour(s))  Blood culture (routine x 2)     Status: None (Preliminary result)   Collection Time: 07/27/23 10:03 PM   Specimen: BLOOD  Result Value Ref Range Status   Specimen Description BLOOD RIGHT ASSIST CONTROL  Final   Special Requests   Final    BOTTLES DRAWN AEROBIC  AND ANAEROBIC Blood Culture results may not be optimal due to an inadequate volume of blood received in culture bottles   Culture   Final    NO GROWTH < 12 HOURS Performed at [facility], [address], [place], [place] [address]    Report Status PENDING  Incomplete  Blood culture (routine x 2)     Status: None (Preliminary result)   Collection Time: 07/27/23 10:03 PM   Specimen: BLOOD  Result Value Ref Range Status   Specimen Description BLOOD LEFT ASSIST CONTROL  Final   Special Requests   Final    BOTTLES DRAWN AEROBIC AND ANAEROBIC Blood Culture adequate volume   Culture   Final    NO GROWTH < 12 HOURS Performed at [facility], [address], [place], [place] [address]    Report Status PENDING  Incomplete         Radiology Studies: CT ABDOMEN PELVIS W CONTRAST  Result Date: 07/27/2023 CLINICAL DATA:  Right lower  quadrant abdominal pain. No bowel movement in 3 days EXAM: CT ABDOMEN AND PELVIS WITH CONTRAST TECHNIQUE: Multidetector CT imaging of the abdomen and pelvis was performed using the standard protocol following bolus administration of intravenous contrast. RADIATION DOSE REDUCTION: This exam was performed according to the departmental dose-optimization program which includes automated exposure control, adjustment of the mA and/or kV according to patient size and/or use of iterative reconstruction technique. CONTRAST:  80mL OMNIPAQUE IOHEXOL 300 MG/ML  SOLN COMPARISON:  Same day abdominal radiograph FINDINGS: Lower chest: No acute abnormality. Hepatobiliary: Ill-defined focus of hypoattenuation in the left hepatic lobe superior to the gallbladder. This measures 4.1 x 2.4 cm. Normal gallbladder. The intrahepatic bile ducts are moderately dilated. No dilation of the common bile duct. No radiopaque stone. Pancreas: Unremarkable. No pancreatic ductal dilatation or surrounding inflammatory changes. Spleen: Unremarkable. Adrenals/Urinary Tract: Bilateral adrenal  gland hyperplasia. There is nodular thickening in the left adrenal gland measuring 1.5 cm mild enhancement (Hounsfield units 73). Low-attenuation lesions in the kidneys are statistically likely to represent cysts. No follow-up is required. No urinary calculi or hydronephrosis. Stomach/Bowel: Large stool ball in the rectum. Moderate colonic stool load. Extensive colonic diverticulosis without evidence of diverticulitis. Stomach is within normal limits. The appendix is normal. Vascular/Lymphatic: Aortic atherosclerosis. Portal vein is patent. No enlarged abdominal or pelvic lymph nodes. Reproductive: Status post hysterectomy. No adnexal masses. Other: No free intraperitoneal fluid or air. Musculoskeletal: No acute fracture or destructive osseous lesion. IMPRESSION: 1. Constipation with large stool ball in the rectum. 2. Ill-defined focus of hypoattenuation in the left hepatic lobe superior to the gallbladder measuring 4.1 cm. Given associated intrahepatic biliary dilation this is concerning for metastasis or primary HCC causing biliary obstruction. Nonemergent hepatic protocol MRI of the abdomen with and without contrast is recommended. 3. Hyperplasia of the bilateral adrenal glands with 1.5 cm nodular thickening in the left adrenal gland. This can also be further evaluated with MRI. 4. Extensive colonic diverticulosis without diverticulitis. Aortic Atherosclerosis (ICD10-I70.0). Electronically Signed   By: Minerva Fester M.D.   On: 07/27/2023 21:39   DG Abd 1 View  Result Date: 07/27/2023 CLINICAL DATA:  Abdomen pain EXAM: ABDOMEN - 1 VIEW COMPARISON:  None Available. FINDINGS: Nonobstructed gas pattern. Large stool in the colon and rectum. Numerous pelvic surgical clips. Pelvic phleboliths. IMPRESSION: Nonobstructed gas pattern with large stool in the colon and rectum suggesting constipation. Electronically Signed   By: Jasmine Pang M.D.   On: 07/27/2023 17:02        Scheduled Meds:  insulin aspart   0-5 Units Subcutaneous QHS   insulin aspart  0-9 Units Subcutaneous TID WC   Continuous Infusions:  ceFEPime (MAXIPIME) IV 2 g (07/28/23 0923)   [START ON 07/29/2023] cefTRIAXone (ROCEPHIN)  IV     metronidazole 500 mg (07/28/23 1009)     LOS: 1 day    Time spent: 35 mins    Kyren Knick Sherryll Burger, MD [facility] Pager [phone]  If 7PM-7AM, please contact night-coverage [email or web address] Password [facility] 07/28/2023, 6:57 PM

## 2023-07-29 ENCOUNTER — Inpatient Hospital Stay: Payer: Medicare HMO

## 2023-07-29 DIAGNOSIS — R7989 Other specified abnormal findings of blood chemistry: Secondary | ICD-10-CM | POA: Diagnosis not present

## 2023-07-29 DIAGNOSIS — D72829 Elevated white blood cell count, unspecified: Secondary | ICD-10-CM | POA: Diagnosis not present

## 2023-07-29 DIAGNOSIS — R160 Hepatomegaly, not elsewhere classified: Secondary | ICD-10-CM | POA: Diagnosis not present

## 2023-07-29 DIAGNOSIS — R1011 Right upper quadrant pain: Secondary | ICD-10-CM | POA: Diagnosis not present

## 2023-07-29 DIAGNOSIS — R112 Nausea with vomiting, unspecified: Secondary | ICD-10-CM

## 2023-07-29 DIAGNOSIS — Z515 Encounter for palliative care: Secondary | ICD-10-CM

## 2023-07-29 DIAGNOSIS — K8309 Other cholangitis: Secondary | ICD-10-CM | POA: Diagnosis not present

## 2023-07-29 LAB — PHOSPHORUS: Phosphorus: 2 mg/dL — ABNORMAL LOW (ref 2.5–4.6)

## 2023-07-29 LAB — CBC
HCT: 31.5 % — ABNORMAL LOW (ref 36.0–46.0)
Hemoglobin: 11.1 g/dL — ABNORMAL LOW (ref 12.0–15.0)
MCH: 30.5 pg (ref 26.0–34.0)
MCHC: 35.2 g/dL (ref 30.0–36.0)
MCV: 86.5 fL (ref 80.0–100.0)
Platelets: 268 10*3/uL (ref 150–400)
RBC: 3.64 MIL/uL — ABNORMAL LOW (ref 3.87–5.11)
RDW: 14 % (ref 11.5–15.5)
WBC: 11.6 10*3/uL — ABNORMAL HIGH (ref 4.0–10.5)
nRBC: 0 % (ref 0.0–0.2)

## 2023-07-29 LAB — GLUCOSE, CAPILLARY
Glucose-Capillary: 101 mg/dL — ABNORMAL HIGH (ref 70–99)
Glucose-Capillary: 114 mg/dL — ABNORMAL HIGH (ref 70–99)
Glucose-Capillary: 161 mg/dL — ABNORMAL HIGH (ref 70–99)
Glucose-Capillary: 174 mg/dL — ABNORMAL HIGH (ref 70–99)

## 2023-07-29 LAB — MAGNESIUM: Magnesium: 2 mg/dL (ref 1.7–2.4)

## 2023-07-29 LAB — COMPREHENSIVE METABOLIC PANEL
ALT: 247 U/L — ABNORMAL HIGH (ref 0–44)
AST: 257 U/L — ABNORMAL HIGH (ref 15–41)
Albumin: 2.6 g/dL — ABNORMAL LOW (ref 3.5–5.0)
Alkaline Phosphatase: 383 U/L — ABNORMAL HIGH (ref 38–126)
Anion gap: 9 (ref 5–15)
BUN: 17 mg/dL (ref 8–23)
CO2: 22 mmol/L (ref 22–32)
Calcium: 8.3 mg/dL — ABNORMAL LOW (ref 8.9–10.3)
Chloride: 100 mmol/L (ref 98–111)
Creatinine, Ser: 0.74 mg/dL (ref 0.44–1.00)
GFR, Estimated: >60 mL/min (ref >60)
Glucose, Bld: 98 mg/dL (ref 70–99)
Potassium: 3.8 mmol/L (ref 3.5–5.1)
Sodium: 131 mmol/L — ABNORMAL LOW (ref 135–145)
Total Bilirubin: 5.5 mg/dL — ABNORMAL HIGH (ref <1.2)
Total Protein: 5 g/dL — ABNORMAL LOW (ref 6.5–8.1)

## 2023-07-29 LAB — AFP TUMOR MARKER: AFP, Serum, Tumor Marker: 2.3 ng/mL (ref 0.0–9.2)

## 2023-07-29 LAB — CANCER ANTIGEN 19-9: CA 19-9: 1935 U/mL — ABNORMAL HIGH (ref 0–35)

## 2023-07-29 LAB — CEA: CEA: 19.6 ng/mL — ABNORMAL HIGH (ref 0.0–4.7)

## 2023-07-29 MED ORDER — BACLOFEN 5 MG PO TABS: 5.0000 mg | ORAL_TABLET | Freq: Every evening | ORAL | Status: DC | PRN

## 2023-07-29 MED ORDER — LISINOPRIL 10 MG PO TABS
20.0000 mg | ORAL_TABLET | Freq: Every day | ORAL | Status: DC
  Administered 2023-07-29 – 2023-07-31 (×3): 20 mg via ORAL
  Filled 2023-07-29 (×3): qty 2

## 2023-07-29 MED ORDER — ALUM & MAG HYDROXIDE-SIMETH 200-200-20 MG/5ML PO SUSP
30.0000 mL | ORAL | Status: DC | PRN
  Administered 2023-07-29 – 2023-08-01 (×4): 30 mL via ORAL
  Filled 2023-07-29 (×4): qty 30

## 2023-07-29 MED ORDER — GADOBUTROL 1 MMOL/ML IV SOLN
6.0000 mL | Freq: Once | INTRAVENOUS | Status: AC | PRN
  Administered 2023-07-29: 6 mL via INTRAVENOUS

## 2023-07-29 MED ORDER — HEPARIN SODIUM (PORCINE) 5000 UNIT/ML IJ SOLN
5000.0000 [IU] | Freq: Three times a day (TID) | INTRAMUSCULAR | Status: DC
  Administered 2023-07-29 – 2023-07-30 (×2): 5000 [IU] via SUBCUTANEOUS
  Filled 2023-07-29 (×2): qty 1

## 2023-07-29 MED ORDER — POTASSIUM & SODIUM PHOSPHATES 280-160-250 MG PO PACK
2.0000 | PACK | Freq: Once | ORAL | Status: AC
  Administered 2023-07-29: 2 via ORAL
  Filled 2023-07-29 (×2): qty 2

## 2023-07-29 NOTE — Care Management Important Message (Signed)
Important Message  Patient Details  Name: [name] MRN: [id number] Date of Birth: May 19, 1946   Important Message Given:  N/A - LOS <3 / Initial given by admissions     Olegario Messier A Krissy Orebaugh 07/29/2023, 9:22 AM

## 2023-07-29 NOTE — Consult Note (Addendum)
PHARMACY CONSULT NOTE - ELECTROLYTES  Pharmacy Consult for Electrolyte Monitoring and Replacement   Recent Labs: Potassium (mmol/L)  Date Value  07/29/2023 3.8  04/22/2012 3.7   Magnesium (mg/dL)  Date Value  16/06/9603 2.0  04/22/2012 1.9   Calcium (mg/dL)  Date Value  54/05/8118 8.3 (L)   Calcium, Total (mg/dL)  Date Value  14/78/2956 8.7   Albumin (g/dL)  Date Value  21/30/8657 2.6 (L)  04/21/2012 3.8   Phosphorus (mg/dL)  Date Value  84/69/6295 2.0 (L)   Sodium (mmol/L)  Date Value  07/29/2023 131 (L)  04/22/2012 144   Height: 5\' 3"  (160 cm) Weight: 56.7 kg (125 lb) IBW/kg (Calculated) : 52.4 Estimated Creatinine Clearance: 48.7 mL/min (by C-G formula based on SCr of 0.74 mg/dL).  Assessment  [name] is a 77 y.o. female presenting with abdominal pain with subsequent findings concerning for liver mass and malignant process. PMH significant for hypertension, non-insulin-dependent diabetes mellitus, hyperlipidemia, history of tobacco use. Pharmacy has been consulted to monitor and replace electrolytes.  Diet: heart healthy/carb modified MIVF: N/A Pertinent medications: N/A  Goal of Therapy: Electrolytes within normal limits  Plan:  Phos = 2.0, give Phos-NAK 2 packets x 1 Follow-up electrolytes tomorrow AM  Thank you for allowing pharmacy to be a part of this patient's care.  Barrie Folk, PharmD 07/29/2023 7:07 AM

## 2023-07-29 NOTE — Progress Notes (Signed)
[facility]  - [place] at [facility]   PATIENT NAME: [name]    MR#:  [id number]  DATE OF BIRTH:  12-Jan-1946  SUBJECTIVE:  granddaughter and husband at bedside. Patient seen earlier prior to MRI abdomen. Overall much calmer. Discussed importance of getting MRI abdomen done. Patient agreeable.    VITALS:  Blood pressure (!) 162/47, pulse (!) 59, temperature 98.4 F (36.9 C), resp. rate 16, height 5\' 3"  (1.6 m), weight 56.7 kg, SpO2 99%.  PHYSICAL EXAMINATION:   GENERAL:  77 y.o.-year-old patient with no acute distress.  LUNGS: Normal breath sounds bilaterally, no wheezing CARDIOVASCULAR: S1, S2 normal. No murmur   ABDOMEN: Soft, nontender, nondistended. Bowel sounds present.  EXTREMITIES: No  edema b/l.    NEUROLOGIC: nonfocal  patient is alert and awake  LABORATORY PANEL:  CBC Recent Labs  Lab 07/29/23 0453  WBC 11.6*  HGB 11.1*  HCT 31.5*  PLT 268    Chemistries  Recent Labs  Lab 07/29/23 0453  NA 131*  K 3.8  CL 100  CO2 22  GLUCOSE 98  BUN 17  CREATININE 0.74  CALCIUM 8.3*  MG 2.0  AST 257*  ALT 247*  ALKPHOS 383*  BILITOT 5.5*   Cardiac Enzymes No results for input(s): "TROPONINI" in the last 168 hours. RADIOLOGY:  MR ABDOMEN MRCP W WO CONTAST  Result Date: 07/29/2023 CLINICAL DATA:  Infiltrative hepatic mass. MRI recommended for further characterization. EXAM: MRI ABDOMEN WITHOUT AND WITH CONTRAST (INCLUDING MRCP) TECHNIQUE: Multiplanar multisequence MR imaging of the abdomen was performed both before and after the administration of intravenous contrast. Heavily T2-weighted images of the biliary and pancreatic ducts were obtained, and three-dimensional MRCP images were rendered by post processing. CONTRAST:  6mL GADAVIST GADOBUTROL 1 MMOL/ML IV SOLN COMPARISON:  CT 07/26/2024 FINDINGS: Exam is degraded by patient respiratory motion. Lower chest:  Lung bases are clear. Hepatobiliary: Lobular mass in the central liver (segment  4A) is well-defined on diffusion-weighted imaging measuring 4.2 x 4.1 cm (image 76/series 9. No additional hepatic lesions identified. The mass is hypoenhancing (series 19). There is associated intrahepatic biliary duct dilatation involving the LEFT and RIGHT hepatic ductal systems. Ductal dilatation extends to the porta hepatis with abrupt narrowing of the ducks (image 46/13. The common hepatic duct is difficult to follow. The common bile duct is normal caliber at 5 mm. Gallbladder self appears normal. Pancreas: No pancreatic lesion no duct dilatation. Exam exam is degraded by respiratory motion. Spleen: Normal spleen. Adrenals/urinary tract: Adrenal glands difficult to evaluate due to motion degradation. Favor hyperplasia or adrenal adenomas. Stomach/Bowel: Stomach and limited of the small bowel is unremarkable Vascular/Lymphatic: Abdominal aortic normal caliber. No retroperitoneal periportal lymphadenopathy. Musculoskeletal: No aggressive osseous lesion IMPRESSION: 1. Exam is degraded by patient respiratory motion. Little information added to the CT interpretation. 2. Hypoenhancing mass centered in the central LEFT hepatic lobe (segment 4A) obstructs the LEFT and RIGHT intrahepatic ductal systems at the confluence (Klatskin type obstruction). 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 3. No additional sites metastatic disease the liver. 4. Adrenal glands difficult evaluation due to respiratory motion. Favor adrenal adenomas or hyperplasia. Electronically Signed   By: Genevive Bi M.D.   On: 07/29/2023 15:52   MR 3D Recon At Scanner  Result Date: 07/29/2023 CLINICAL DATA:  Infiltrative hepatic mass. MRI recommended for further characterization. EXAM: MRI ABDOMEN WITHOUT AND WITH CONTRAST (INCLUDING MRCP) TECHNIQUE: Multiplanar multisequence MR imaging of the abdomen was performed both before and after the administration of intravenous contrast.  Heavily T2-weighted images of the biliary and  pancreatic ducts were obtained, and three-dimensional MRCP images were rendered by post processing. CONTRAST:  6mL GADAVIST GADOBUTROL 1 MMOL/ML IV SOLN COMPARISON:  CT 07/26/2024 FINDINGS: Exam is degraded by patient respiratory motion. Lower chest:  Lung bases are clear. Hepatobiliary: Lobular mass in the central liver (segment 4A) is well-defined on diffusion-weighted imaging measuring 4.2 x 4.1 cm (image 76/series 9. No additional hepatic lesions identified. The mass is hypoenhancing (series 19). There is associated intrahepatic biliary duct dilatation involving the LEFT and RIGHT hepatic ductal systems. Ductal dilatation extends to the porta hepatis with abrupt narrowing of the ducks (image 46/13. The common hepatic duct is difficult to follow. The common bile duct is normal caliber at 5 mm. Gallbladder self appears normal. Pancreas: No pancreatic lesion no duct dilatation. Exam exam is degraded by respiratory motion. Spleen: Normal spleen. Adrenals/urinary tract: Adrenal glands difficult to evaluate due to motion degradation. Favor hyperplasia or adrenal adenomas. Stomach/Bowel: Stomach and limited of the small bowel is unremarkable Vascular/Lymphatic: Abdominal aortic normal caliber. No retroperitoneal periportal lymphadenopathy. Musculoskeletal: No aggressive osseous lesion IMPRESSION: 1. Exam is degraded by patient respiratory motion. Little information added to the CT interpretation. 2. Hypoenhancing mass centered in the central LEFT hepatic lobe (segment 4A) obstructs the LEFT and RIGHT intrahepatic ductal systems at the confluence (Klatskin type obstruction). 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 3. No additional sites metastatic disease the liver. 4. Adrenal glands difficult evaluation due to respiratory motion. Favor adrenal adenomas or hyperplasia. Electronically Signed   By: Genevive Bi M.D.   On: 07/29/2023 15:52   CT CHEST W CONTRAST  Result Date: 07/29/2023 CLINICAL  DATA:  77 year old female with history of unintended weight loss. EXAM: CT CHEST WITH CONTRAST TECHNIQUE: Multidetector CT imaging of the chest was performed during intravenous contrast administration. RADIATION DOSE REDUCTION: This exam was performed according to the departmental dose-optimization program which includes automated exposure control, adjustment of the mA and/or kV according to patient size and/or use of iterative reconstruction technique. CONTRAST:  75mL OMNIPAQUE IOHEXOL 300 MG/ML  SOLN COMPARISON:  No priors. FINDINGS: Cardiovascular: Heart size is normal. There is no significant pericardial fluid, thickening or pericardial calcification. There is aortic atherosclerosis, as well as atherosclerosis of the great vessels of the mediastinum and the coronary arteries, including calcified atherosclerotic plaque in the left anterior descending, left circumflex and right coronary arteries. Calcifications of the aortic valve. Calcifications of the mitral annulus. Mediastinum/Nodes: No pathologically enlarged mediastinal or hilar lymph nodes. Small hiatal hernia. No axillary lymphadenopathy. Lungs/Pleura: Bilateral apical nodular pleuroparenchymal thickening and architectural distortion is noted, most compatible with areas of chronic post infectious or inflammatory scarring. A few scattered tiny 1-3 mm pulmonary nodules are noted throughout the lungs bilaterally, nonspecific. No other larger more suspicious appearing pulmonary nodules or masses are noted. No acute consolidative airspace disease. No pleural effusions. Upper Abdomen: Poorly defined mass-like area in the central liver estimated to measure 4.8 x 3.5 cm, but incompletely imaged (axial image 154 of series 2). Severe intrahepatic biliary ductal dilatation also noted. Atherosclerotic calcifications in the abdominal aorta. Both adrenal glands are enlarged, with dominant nodule in the left adrenal gland measuring up to 2.4 x 1.9 cm. Musculoskeletal:  There are no aggressive appearing lytic or blastic lesions noted in the visualized portions of the skeleton. IMPRESSION: 1. Multiple tiny 1-3 mm pulmonary nodules, nonspecific, but statistically likely benign. No follow-up needed if patient is low-risk (and has no known or suspected primary neoplasm). Non-contrast  chest CT can be considered in 12 months if patient is high-risk. This recommendation follows the consensus statement: Guidelines for Management of Incidental Pulmonary Nodules Detected on CT Images: From the Fleischner Society 2017; Radiology 2017; 284:228-243. 2. Abdominal findings strongly suggestive of underlying malignancy, partially imaged, as above. As previously suggested, further evaluation with MRI of the abdomen with and without IV gadolinium is recommended to better evaluate these findings. 3. Aortic atherosclerosis, in addition to three-vessel coronary artery disease. Assessment for potential risk factor modification, dietary therapy or pharmacologic therapy may be warranted, if clinically indicated. 4. There are calcifications of the aortic valve and mitral annulus. Echocardiographic correlation for evaluation of potential valvular dysfunction may be warranted if clinically indicated. Aortic Atherosclerosis (ICD10-I70.0). Electronically Signed   By: Trudie Reed M.D.   On: 07/29/2023 06:56   CT ABDOMEN PELVIS W CONTRAST  Result Date: 07/27/2023 CLINICAL DATA:  Right lower quadrant abdominal pain. No bowel movement in 3 days EXAM: CT ABDOMEN AND PELVIS WITH CONTRAST TECHNIQUE: Multidetector CT imaging of the abdomen and pelvis was performed using the standard protocol following bolus administration of intravenous contrast. RADIATION DOSE REDUCTION: This exam was performed according to the departmental dose-optimization program which includes automated exposure control, adjustment of the mA and/or kV according to patient size and/or use of iterative reconstruction technique. CONTRAST:   80mL OMNIPAQUE IOHEXOL 300 MG/ML  SOLN COMPARISON:  Same day abdominal radiograph FINDINGS: Lower chest: No acute abnormality. Hepatobiliary: Ill-defined focus of hypoattenuation in the left hepatic lobe superior to the gallbladder. This measures 4.1 x 2.4 cm. Normal gallbladder. The intrahepatic bile ducts are moderately dilated. No dilation of the common bile duct. No radiopaque stone. Pancreas: Unremarkable. No pancreatic ductal dilatation or surrounding inflammatory changes. Spleen: Unremarkable. Adrenals/Urinary Tract: Bilateral adrenal gland hyperplasia. There is nodular thickening in the left adrenal gland measuring 1.5 cm mild enhancement (Hounsfield units 73). Low-attenuation lesions in the kidneys are statistically likely to represent cysts. No follow-up is required. No urinary calculi or hydronephrosis. Stomach/Bowel: Large stool ball in the rectum. Moderate colonic stool load. Extensive colonic diverticulosis without evidence of diverticulitis. Stomach is within normal limits. The appendix is normal. Vascular/Lymphatic: Aortic atherosclerosis. Portal vein is patent. No enlarged abdominal or pelvic lymph nodes. Reproductive: Status post hysterectomy. No adnexal masses. Other: No free intraperitoneal fluid or air. Musculoskeletal: No acute fracture or destructive osseous lesion. IMPRESSION: 1. Constipation with large stool ball in the rectum. 2. Ill-defined focus of hypoattenuation in the left hepatic lobe superior to the gallbladder measuring 4.1 cm. Given associated intrahepatic biliary dilation this is concerning for metastasis or primary HCC causing biliary obstruction. Nonemergent hepatic protocol MRI of the abdomen with and without contrast is recommended. 3. Hyperplasia of the bilateral adrenal glands with 1.5 cm nodular thickening in the left adrenal gland. This can also be further evaluated with MRI. 4. Extensive colonic diverticulosis without diverticulitis. Aortic Atherosclerosis (ICD10-I70.0).  Electronically Signed   By: Minerva Fester M.D.   On: 07/27/2023 21:39   DG Abd 1 View  Result Date: 07/27/2023 CLINICAL DATA:  Abdomen pain EXAM: ABDOMEN - 1 VIEW COMPARISON:  None Available. FINDINGS: Nonobstructed gas pattern. Large stool in the colon and rectum. Numerous pelvic surgical clips. Pelvic phleboliths. IMPRESSION: Nonobstructed gas pattern with large stool in the colon and rectum suggesting constipation. Electronically Signed   By: Jasmine Pang M.D.   On: 07/27/2023 17:02    Assessment and Plan  77 year old female with history of hypertension, non-insulin-dependent diabetes mellitus, hyperlipidemia, history of tobacco  use, who presents to the emergency department for chief concerns of abdominal pain   Elevated LFTs Liver mass obstructive jaundice -- MRI notable for infiltrative cholangiocarcinoma Blood cultures x 2 neg thus far -- rising bilirubin 3.3--- 5.1 -- discussed with G.I. and oncology Dr. Cathie Hoops--- recommends percutaneous biliary drain with biopsy of liver mass --CA 19-9 1935 -- I have message to interventional radiology to see if this can be accommodated on Friday   Leukocytosis --Differentials include sepsis although doubt it -- sepsis ruled out --Blood cultures x 2 are neg thus far --patient was on broad-spectrum cefepime with metronidazole 500 mg IV twice daily-- discontinue since etiology is highly likely cancer   AKI (acute kidney injury) (HCC) Resolved with hydration   Diabetes mellitus type 2, noninsulin dependent (HCC) Insulin SSI with at bedtime coverage ordered Home metformin and semaglutide not resumed on admission   Hyperlipidemia Home atorvastatin  not resumed on admission due to elevated LFTs   Essential hypertension -- resume lisinopril  Hypokalemia potassium 3.8   Acute delirium/agitation Haldol prn -- improved. Patient much calm   GOC Palliative care c/s, overall poor prognosis      Procedures: Family communication : husband  and granddaughter  consults : G.I., oncology CODE STATUS: full DVT Prophylaxis : heparin Level of care: Telemetry Medical Status is: Inpatient Remains inpatient appropriate because: workup for liver mass    TOTAL TIME TAKING CARE OF THIS PATIENT: 35 minutes.  >50% time spent on counselling and coordination of care  Note: This dictation was prepared with Dragon dictation along with smaller phrase technology. Any transcriptional errors that result from this process are unintentional.  Enedina Finner M.D    [facility]   CC: Primary care physician; Rayetta Humphrey, MD

## 2023-07-29 NOTE — Consult Note (Signed)
Consultation Note Date: 07/29/2023 at 1245  Patient Name: [name]  DOB: 16-Sep-1945  MRN: [id number]  Age / Sex: 77 y.o., female  PCP: Rayetta Humphrey, MD Referring Physician: Enedina Finner, MD  HPI/Patient Profile: 77 y.o. 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   Patient found to have elevated LFTs-suspected secondary to cholangitis complicated by possible metastasis versus hepatocellular carcinoma causing dilatation, leukocytosis, liver mass causing biliary obstruction, AKI, hypokalemia, and acute delirium/agitation.  Patient is being followed by GI and medical oncology.  PMT was consulted to discuss goals of care.  Clinical Assessment and Goals of Care: Extensive chart review completed prior to meeting patient including labs, vital signs, imaging, progress notes, orders, and available advanced directive documents from current and previous encounters. I then met with patient, her husband, her 2 daughters, and her grandchildren at bedside to discuss diagnosis prognosis, GOC, EOL wishes, disposition and options.  I introduced Palliative Medicine as specialized medical care for people living with serious illness. It focuses on providing relief from the symptoms and stress of a serious illness. The goal is to improve quality of life for both the patient and the family.  We discussed a brief life review of the patient.  She is married and has 2 children from a previous marriage.  Both children are daughters that live nearby and are heavily involved in her care.  She lives at home with her husband and was independent with ADLs PTA.  We discussed patient's current illness-potential for hepatocellular carcinoma or pancreatic carcinoma-and  what it means in the larger context of patient's on-going co-morbidities-decline in p.o. intake and functional status.    Patient has been have just met with medical oncologist Dr. Cathie Hoops to discuss next steps.  Reviewed her orders for MRI and MRCP of abdomen.  Reviewed that patient will need a biopsy as well as other information from her blood work combined with results of MRI to get full picture of next steps.  Space and opportunity provided for patient and family share their thoughts and emotions regarding patient's current health situation.  Patient's husband vocalized that he would never want her to have any chemo, radiation, or surgery feels like and improve her quality of life.  Family noted and was in agreement.  In light of patient's comments, I discussed boundaries of care, specifically CODE STATUS.  Discussed difference between DNR and full code.  Patient's husband adamant that patient would want to be a DNR.  Patient nodded in agreement with would not verbally confirm her wishes.  Daughters at bedside shared that they want to go by what ever the patient says herself.  Patient declined to interject her thoughts during CODE STATUS discussion today.  Full code to remain but ongoing discussions to continue around patient's wishes for intubation or ACLS protocol.  Attempted to gauge patient's understanding of her current medical situation.  She  shares she is not really sure if there has been a lot of information thrown away this week.  I discussed that more discussions will come as far as treatment options.  Encourage patient and family to keep patient's values and goals at the center and focus of all medical decision making stepping forward.  Family awaiting results of MRI and other blood work to have better understanding of treatment plan options.  Discussed that palliative team has outpatient providers in the cancer center.  Reviewed that when patient is discharged and set up with help patient  cancer center appointments that they can also meet with outpatient provider Josh borders.  Family is interested in palliative support at discharge.  Order placed and [employer] notified of new referral.  Family aware that [facility] staffing is low tomorrow but that they remain available for any urgent palliative needs.  Otherwise, I plan to follow-up with patient and family upon my return on Friday.  Primary Decision Maker PATIENT  Physical Exam Vitals reviewed.  Constitutional:      General: She is not in acute distress.    Appearance: She is normal weight.  HENT:     Head: Normocephalic.  Cardiovascular:     Rate and Rhythm: Normal rate.     Heart sounds: Normal heart sounds.  Pulmonary:     Effort: Pulmonary effort is normal.  Skin:    General: Skin is warm and dry.  Neurological:     Mental Status: She is alert and oriented to person, place, and time.  Psychiatric:        Mood and Affect: Mood normal. Mood is not anxious or depressed.     Palliative Assessment/Data: 60%     Thank you for this consult. Palliative medicine will continue to follow and assist holistically.   Time Total: 75 minutes  Time spent includes: Detailed review of medical records (labs, imaging, vital signs), medically appropriate exam (mental status, respiratory, cardiac, skin), discussed with treatment team, counseling and educating patient, family and staff, documenting clinical information, medication management and coordination of care.  Signed by: Georgiann Cocker, DNP, FNP-BC Palliative Medicine   Please contact Palliative Medicine Team providers via [facility] for questions and concerns.

## 2023-07-29 NOTE — Progress Notes (Signed)
Hematology/Oncology Progress note [phone] [phone]     Patient Care Team: Rayetta Humphrey, MD as PCP - General (Family Medicine)   Name of the patient: [name]  [id number]  1945-10-21  Date of visit: 07/29/23   INTERVAL HISTORY-   07/28/23 CT chest w contrast showed multiple small nodules 1-49mm.  MRI MRCP not done yesterday due to patient being combative.     Allergies  Allergen Reactions   Ezetimibe Other (See Comments)    heartburn   Simvastatin Other (See Comments)    Acid reflux symptoms   Niacin Other (See Comments)    Heartburn   Pravastatin Other (See Comments)    Acid reflux    Patient Active Problem List   Diagnosis Date Noted   Right upper quadrant abdominal pain 07/28/2023   Hyponatremia 07/28/2023   Hypokalemia 07/27/2023   Elevated LFTs 07/27/2023   Essential hypertension 07/27/2023   Hyperlipidemia 07/27/2023   Diabetes mellitus type 2, noninsulin dependent (HCC) 07/27/2023   AKI (acute kidney injury) (HCC) 07/27/2023   Liver mass 07/27/2023   Leukocytosis 07/27/2023     Past Medical History:  Diagnosis Date   Carcinoma of vaginal vault (HCC)    s/p Hysterectomy with vaginal resection   Diabetes mellitus type 2, controlled (HCC)    Essential hypertension    H/O: CVA (cerebrovascular accident) 04/21/2012   inferior posterior limb of the internal capsule, also tiny punctate acute infarct in the posterior aspect of the right parietal lobe   Hyperlipidemia    Status post cataract surgery      Past Surgical History:  Procedure Laterality Date   ABDOMINAL HYSTERECTOMY     vaginal resection   BREAST BIOPSY Left    bx/clip-neg    Social History   Socioeconomic History   Marital status: Married    Spouse name: Not on file   Number of children: Not on file   Years of education: Not on file   Highest education level: Not on file  Occupational History   Not on file  Tobacco Use   Smoking status:  Every Day    Current packs/day: 0.25    Types: Cigarettes   Smokeless tobacco: Never  Vaping Use   Vaping status: Never Used  Substance and Sexual Activity   Alcohol use: No   Drug use: No   Sexual activity: Not Currently  Other Topics Concern   Not on file  Social History Narrative   She lives at home with her husband. She  smokes about 1/2 pack per day, used to smoke more than 2 packs in the past. She started smoking when she was 77 years old. No history of any alcohol use or drug abuse. She currently works at [employer] in [employer]. She used to work in [employer] in the past.     Social Determinants of Health   Financial Resource Strain: Medium Risk (03/17/2019)   Received from [facility]   Overall Financial Resource Strain (CARDIA)    Difficulty of Paying Living Expenses: Somewhat hard  Food Insecurity: No Food Insecurity (07/28/2023)   Hunger Vital Sign    Worried About Running Out of Food in the Last Year: Never true    Ran Out of Food in the Last Year: Never true  Transportation Needs: No Transportation Needs (07/28/2023)   PRAPARE - Administrator, Civil Service (Medical): No    Lack of Transportation (Non-Medical): No  Physical Activity: Insufficiently Active (03/17/2019)  Received from [facility]   Exercise Vital Sign    Days of Exercise per Week: 3 days    Minutes of Exercise per Session: 30 min  Stress: Not on file  Social Connections: Unknown (03/17/2019)   Received from [facility]   Social Connection and Isolation Panel [NHANES]    Frequency of Communication with Friends and Family: More than three times a week    Frequency of Social Gatherings with Friends and Family: More than three times a week    Attends Religious Services: Not on file    Active Member of Clubs or Organizations: Not on file    Attends Banker Meetings: Not on file    Marital Status: Not on file  Intimate Partner  Violence: Not At Risk (07/28/2023)   Humiliation, Afraid, Rape, and Kick questionnaire    Fear of Current or Ex-Partner: No    Emotionally Abused: No    Physically Abused: No    Sexually Abused: No     Family History  Problem Relation Age of Onset   Peripheral vascular disease Father    Heart disease Mother    Breast cancer Neg Hx      Current Facility-Administered Medications:    acetaminophen (TYLENOL) tablet 650 mg, 650 mg, Oral, Q6H PRN **OR** acetaminophen (TYLENOL) suppository 650 mg, 650 mg, Rectal, Q6H PRN, Cox, Amy N, DO   Baclofen TABS 5 mg, 5 mg, Oral, QHS PRN, Enedina Finner, MD   cefTRIAXone (ROCEPHIN) 2 g in sodium chloride 0.9 % 100 mL IVPB, 2 g, Intravenous, Q24H, Sherryll Burger, Vipul, MD, Last Rate: 200 mL/hr at 07/29/23 1108, 2 g at 07/29/23 1108   haloperidol lactate (HALDOL) injection 1 mg, 1 mg, Intravenous, Q4H PRN, Delfino Lovett, MD, 1 mg at 07/28/23 2115   hydrALAZINE (APRESOLINE) injection 10 mg, 10 mg, Intravenous, Q4H PRN, Cox, Amy N, DO, 10 mg at 07/28/23 2118   insulin aspart (novoLOG) injection 0-5 Units, 0-5 Units, Subcutaneous, QHS, Cox, Amy N, DO, 2 Units at 07/28/23 0131   insulin aspart (novoLOG) injection 0-9 Units, 0-9 Units, Subcutaneous, TID WC, Cox, Amy N, DO, 1 Units at 07/28/23 1140   lisinopril (ZESTRIL) tablet 5 mg, 5 mg, Oral, BID, Enedina Finner, MD   metroNIDAZOLE (FLAGYL) IVPB 500 mg, 500 mg, Intravenous, Q12H, Cox, Amy N, DO, Last Rate: 100 mL/hr at 07/29/23 0959, 500 mg at 07/29/23 0959   morphine (PF) 2 MG/ML injection 2 mg, 2 mg, Intravenous, Q4H PRN, Cox, Amy N, DO   ondansetron (ZOFRAN) tablet 4 mg, 4 mg, Oral, Q6H PRN **OR** ondansetron (ZOFRAN) injection 4 mg, 4 mg, Intravenous, Q6H PRN, Cox, Amy N, DO   senna-docusate (Senokot-S) tablet 1 tablet, 1 tablet, Oral, QHS PRN, Cox, Amy N, DO   Physical exam:  Vitals:   07/28/23 2115 07/28/23 2230 07/29/23 0502 07/29/23 0750  BP: (!) 190/62 (!) 176/53 (!) 164/63 (!) 162/47  Pulse: 60 64 64 (!) 59   Resp: 18  18 16   Temp: 98.4 F (36.9 C)  98.3 F (36.8 C) 98.4 F (36.9 C)  TempSrc: Oral     SpO2: 95%  99% 99%  Weight:      Height:       Physical Exam Constitutional:      General: She is not in acute distress.    Appearance: She is not diaphoretic.  HENT:     Head: Normocephalic and atraumatic.  Cardiovascular:     Rate and Rhythm: Normal rate.  Pulmonary:     Effort: Pulmonary effort is normal. No respiratory distress.  Abdominal:     General: Bowel sounds are normal. There is no distension.     Palpations: Abdomen is soft.  Musculoskeletal:        General: Normal range of motion.     Cervical back: Normal range of motion and neck supple.  Skin:    Findings: Bruising present. No erythema.  Neurological:     Mental Status: She is alert and oriented to person, place, and time. Mental status is at baseline.     Cranial Nerves: No cranial nerve deficit.     Motor: No abnormal muscle tone.  Psychiatric:        Mood and Affect: Affect normal.       Labs    Latest Ref Rng & Units 07/29/2023    4:53 AM 07/28/2023    1:58 AM 07/27/2023    7:20 PM  CBC  WBC 4.0 - 10.5 K/uL 11.6  13.6    13.7  14.4   Hemoglobin 12.0 - 15.0 g/dL 02.7  25.3    66.4  40.3   Hematocrit 36.0 - 46.0 % 31.5  33.7    34.8  34.7   Platelets 150 - 400 K/uL 268  283    289  308       Latest Ref Rng & Units 07/29/2023    4:53 AM 07/28/2023    1:58 AM 07/27/2023    7:20 PM  CMP  Glucose 70 - 99 mg/dL 98  474  259   BUN 8 - 23 mg/dL 17  33  38   Creatinine 0.44 - 1.00 mg/dL 5.63  8.75  6.43   Sodium 135 - 145 mmol/L 131  131  129   Potassium 3.5 - 5.1 mmol/L 3.8  3.0  3.2   Chloride 98 - 111 mmol/L 100  92  87   CO2 22 - 32 mmol/L 22  28  28    Calcium 8.9 - 10.3 mg/dL 8.3  8.4  8.9   Total Protein 6.5 - 8.1 g/dL 5.0  5.9  6.7   Total Bilirubin <1.2 mg/dL 5.5  3.3  2.1   Alkaline Phos 38 - 126 U/L 383  410  446   AST 15 - 41 U/L 257  208  162   ALT 0 - 44 U/L 247  249  269       RADIOGRAPHIC STUDIES: I have personally reviewed the radiological images as listed and agreed with the findings in the report. CT CHEST W CONTRAST  Result Date: 07/29/2023 CLINICAL DATA:  77 year old female with history of unintended weight loss. EXAM: CT CHEST WITH CONTRAST TECHNIQUE: Multidetector CT imaging of the chest was performed during intravenous contrast administration. RADIATION DOSE REDUCTION: This exam was performed according to the departmental dose-optimization program which includes automated exposure control, adjustment of the mA and/or kV according to patient size and/or use of iterative reconstruction technique. CONTRAST:  75mL OMNIPAQUE IOHEXOL 300 MG/ML  SOLN COMPARISON:  No priors. FINDINGS: Cardiovascular: Heart size is normal. There is no significant pericardial fluid, thickening or pericardial calcification. There is aortic atherosclerosis, as well as atherosclerosis of the great vessels of the mediastinum and the coronary arteries, including calcified atherosclerotic plaque in the left anterior descending, left circumflex and right coronary arteries. Calcifications of the aortic valve. Calcifications of the mitral annulus. Mediastinum/Nodes: No pathologically enlarged mediastinal or hilar lymph nodes. Small hiatal hernia. No axillary lymphadenopathy. Lungs/Pleura: Bilateral  apical nodular pleuroparenchymal thickening and architectural distortion is noted, most compatible with areas of chronic post infectious or inflammatory scarring. A few scattered tiny 1-3 mm pulmonary nodules are noted throughout the lungs bilaterally, nonspecific. No other larger more suspicious appearing pulmonary nodules or masses are noted. No acute consolidative airspace disease. No pleural effusions. Upper Abdomen: Poorly defined mass-like area in the central liver estimated to measure 4.8 x 3.5 cm, but incompletely imaged (axial image 154 of series 2). Severe intrahepatic biliary ductal dilatation  also noted. Atherosclerotic calcifications in the abdominal aorta. Both adrenal glands are enlarged, with dominant nodule in the left adrenal gland measuring up to 2.4 x 1.9 cm. Musculoskeletal: There are no aggressive appearing lytic or blastic lesions noted in the visualized portions of the skeleton. IMPRESSION: 1. Multiple tiny 1-3 mm pulmonary nodules, nonspecific, but statistically likely benign. No follow-up needed if patient is low-risk (and has no known or suspected primary neoplasm). Non-contrast chest CT can be considered in 12 months if patient is high-risk. This recommendation follows the consensus statement: Guidelines for Management of Incidental Pulmonary Nodules Detected on CT Images: From the Fleischner Society 2017; Radiology 2017; 284:228-243. 2. Abdominal findings strongly suggestive of underlying malignancy, partially imaged, as above. As previously suggested, further evaluation with MRI of the abdomen with and without IV gadolinium is recommended to better evaluate these findings. 3. Aortic atherosclerosis, in addition to three-vessel coronary artery disease. Assessment for potential risk factor modification, dietary therapy or pharmacologic therapy may be warranted, if clinically indicated. 4. There are calcifications of the aortic valve and mitral annulus. Echocardiographic correlation for evaluation of potential valvular dysfunction may be warranted if clinically indicated. Aortic Atherosclerosis (ICD10-I70.0). Electronically Signed   By: Trudie Reed M.D.   On: 07/29/2023 06:56   CT ABDOMEN PELVIS W CONTRAST  Result Date: 07/27/2023 CLINICAL DATA:  Right lower quadrant abdominal pain. No bowel movement in 3 days EXAM: CT ABDOMEN AND PELVIS WITH CONTRAST TECHNIQUE: Multidetector CT imaging of the abdomen and pelvis was performed using the standard protocol following bolus administration of intravenous contrast. RADIATION DOSE REDUCTION: This exam was performed according to the  departmental dose-optimization program which includes automated exposure control, adjustment of the mA and/or kV according to patient size and/or use of iterative reconstruction technique. CONTRAST:  80mL OMNIPAQUE IOHEXOL 300 MG/ML  SOLN COMPARISON:  Same day abdominal radiograph FINDINGS: Lower chest: No acute abnormality. Hepatobiliary: Ill-defined focus of hypoattenuation in the left hepatic lobe superior to the gallbladder. This measures 4.1 x 2.4 cm. Normal gallbladder. The intrahepatic bile ducts are moderately dilated. No dilation of the common bile duct. No radiopaque stone. Pancreas: Unremarkable. No pancreatic ductal dilatation or surrounding inflammatory changes. Spleen: Unremarkable. Adrenals/Urinary Tract: Bilateral adrenal gland hyperplasia. There is nodular thickening in the left adrenal gland measuring 1.5 cm mild enhancement (Hounsfield units 73). Low-attenuation lesions in the kidneys are statistically likely to represent cysts. No follow-up is required. No urinary calculi or hydronephrosis. Stomach/Bowel: Large stool ball in the rectum. Moderate colonic stool load. Extensive colonic diverticulosis without evidence of diverticulitis. Stomach is within normal limits. The appendix is normal. Vascular/Lymphatic: Aortic atherosclerosis. Portal vein is patent. No enlarged abdominal or pelvic lymph nodes. Reproductive: Status post hysterectomy. No adnexal masses. Other: No free intraperitoneal fluid or air. Musculoskeletal: No acute fracture or destructive osseous lesion. IMPRESSION: 1. Constipation with large stool ball in the rectum. 2. Ill-defined focus of hypoattenuation in the left hepatic lobe superior to the gallbladder measuring 4.1 cm. Given associated intrahepatic biliary dilation  this is concerning for metastasis or primary HCC causing biliary obstruction. Nonemergent hepatic protocol MRI of the abdomen with and without contrast is recommended. 3. Hyperplasia of the bilateral adrenal glands  with 1.5 cm nodular thickening in the left adrenal gland. This can also be further evaluated with MRI. 4. Extensive colonic diverticulosis without diverticulitis. Aortic Atherosclerosis (ICD10-I70.0). Electronically Signed   By: Minerva Fester M.D.   On: 07/27/2023 21:39   DG Abd 1 View  Result Date: 07/27/2023 CLINICAL DATA:  Abdomen pain EXAM: ABDOMEN - 1 VIEW COMPARISON:  None Available. FINDINGS: Nonobstructed gas pattern. Large stool in the colon and rectum. Numerous pelvic surgical clips. Pelvic phleboliths. IMPRESSION: Nonobstructed gas pattern with large stool in the colon and rectum suggesting constipation. Electronically Signed   By: Jasmine Pang M.D.   On: 07/27/2023 17:02    Assessment and plan-   # 4 cm liver mass, nodular thickening of the left adrenal gland Recommend MRI MRCP of abdomen for further evaluation. elevated CEA, CA 19-9,  LDH, possibly pancreatic or biliary origin.  Normal AFP.  Awaiting MRI/MRCP    # Abnormal liver function,?  Secondary to intrahepatic biliary dilatation suspect obstruction. GI recommendation was reviewed. Normal  PT, PTT, negative. hepatitis panel. Bilirubin is rising, likely need PTC   # Leukocytosis ?  Cholangitis/ sepsis-blood culture negative x 2.  Continue broad-spectrum antibiotics.  Plan was discussed with patient and her husband  Thank you for allowing me to participate in the care of this patient.   Rickard Patience, MD, PhD Hematology Oncology 07/29/2023

## 2023-07-30 DIAGNOSIS — R7989 Other specified abnormal findings of blood chemistry: Secondary | ICD-10-CM | POA: Diagnosis not present

## 2023-07-30 DIAGNOSIS — D72829 Elevated white blood cell count, unspecified: Secondary | ICD-10-CM | POA: Diagnosis not present

## 2023-07-30 DIAGNOSIS — R160 Hepatomegaly, not elsewhere classified: Secondary | ICD-10-CM | POA: Diagnosis not present

## 2023-07-30 LAB — BASIC METABOLIC PANEL
Anion gap: 15 (ref 5–15)
BUN: 23 mg/dL (ref 8–23)
CO2: 28 mmol/L (ref 22–32)
Calcium: 8.4 mg/dL — ABNORMAL LOW (ref 8.9–10.3)
Chloride: 92 mmol/L — ABNORMAL LOW (ref 98–111)
Creatinine, Ser: 0.78 mg/dL (ref 0.44–1.00)
GFR, Estimated: >60 mL/min (ref >60)
Glucose, Bld: 196 mg/dL — ABNORMAL HIGH (ref 70–99)
Potassium: 3.2 mmol/L — ABNORMAL LOW (ref 3.5–5.1)
Sodium: 135 mmol/L (ref 135–145)

## 2023-07-30 LAB — GLUCOSE, CAPILLARY
Glucose-Capillary: 242 mg/dL — ABNORMAL HIGH (ref 70–99)
Glucose-Capillary: 250 mg/dL — ABNORMAL HIGH (ref 70–99)
Glucose-Capillary: 252 mg/dL — ABNORMAL HIGH (ref 70–99)
Glucose-Capillary: 193 mg/dL — ABNORMAL HIGH (ref 70–99)

## 2023-07-30 LAB — PHOSPHORUS: Phosphorus: 2.5 mg/dL (ref 2.5–4.6)

## 2023-07-30 LAB — MAGNESIUM: Magnesium: 2.2 mg/dL (ref 1.7–2.4)

## 2023-07-30 MED ORDER — POTASSIUM CHLORIDE CRYS ER 20 MEQ PO TBCR
40.0000 meq | EXTENDED_RELEASE_TABLET | Freq: Once | ORAL | Status: AC
  Administered 2023-07-30: 40 meq via ORAL
  Filled 2023-07-30: qty 2

## 2023-07-30 MED ORDER — HEPARIN SODIUM (PORCINE) 5000 UNIT/ML IJ SOLN
5000.0000 [IU] | Freq: Three times a day (TID) | INTRAMUSCULAR | Status: DC
  Administered 2023-08-01 (×2): 5000 [IU] via SUBCUTANEOUS
  Filled 2023-07-30 (×2): qty 1

## 2023-07-30 NOTE — Progress Notes (Signed)
Mobility Specialist - Progress Note    07/30/23 0833  Mobility  Activity Stood at bedside;Ambulated independently to bathroom  Level of Assistance Independent  Assistive Device None  Distance Ambulated (ft) 10 ft  Range of Motion/Exercises Active  Activity Response Tolerated well  Mobility Referral Yes  $Mobility charge 1 Mobility  Mobility Specialist Start Time (ACUTE ONLY) 0830  Mobility Specialist Stop Time (ACUTE ONLY) 0839  Mobility Specialist Time Calculation (min) (ACUTE ONLY) 9 min   Author responding to bed alarm, pt attempting to get out of bed. Pt STS and ambulates to bathroom Indep with no AD (Standby due to fall risk). Pt endorses nausea and RN present at bedside to give meds. Pt returned to bed and left with needs in reach. Bed alarm activated.   Johnathan Hausen Mobility Specialist 07/30/23, 8:45 AM

## 2023-07-30 NOTE — Consult Note (Signed)
PHARMACY CONSULT NOTE - ELECTROLYTES  Pharmacy Consult for Electrolyte Monitoring and Replacement   Recent Labs: Potassium (mmol/L)  Date Value  07/30/2023 3.2 (L)  04/22/2012 3.7   Magnesium (mg/dL)  Date Value  09/81/1914 2.2  04/22/2012 1.9   Calcium (mg/dL)  Date Value  78/29/5621 8.4 (L)   Calcium, Total (mg/dL)  Date Value  30/86/5784 8.7   Albumin (g/dL)  Date Value  69/62/9528 2.6 (L)  04/21/2012 3.8   Phosphorus (mg/dL)  Date Value  41/32/4401 2.5   Sodium (mmol/L)  Date Value  07/30/2023 135  04/22/2012 144   Height: 5\' 3"  (160 cm) Weight: 56.7 kg (125 lb) IBW/kg (Calculated) : 52.4 Estimated Creatinine Clearance: 48.7 mL/min (by C-G formula based on SCr of 0.78 mg/dL).  Assessment  [name] is a 77 y.o. female presenting with abdominal pain with subsequent findings concerning for liver mass and malignant process. PMH significant for hypertension, non-insulin-dependent diabetes mellitus, hyperlipidemia, history of tobacco use. Pharmacy has been consulted to monitor and replace electrolytes.  Diet: heart healthy/carb modified MIVF: N/A Pertinent medications: N/A  Goal of Therapy: Electrolytes within normal limits  Plan:  K 3.2, Kcl 40 mEq PO x 1 Follow-up electrolytes tomorrow AM  Thank you for allowing pharmacy to be a part of this patient's care.  Tressie Ellis 07/30/2023 7:03 AM

## 2023-07-30 NOTE — Progress Notes (Signed)
   07/30/23 0849  Assess: MEWS Score  Temp 98.1 F (36.7 C)  BP (!) 221/81  MAP (mmHg) 123  Pulse Rate 80  Resp 16  SpO2 98 %  O2 Device Room Air  Assess: MEWS Score  MEWS Temp 0  MEWS Systolic 2  MEWS Pulse 0  MEWS RR 0  MEWS LOC 0  MEWS Score 2  MEWS Score Color Yellow  Assess: if the MEWS score is Yellow or Red  Were vital signs accurate and taken at a resting state? Yes  Does the patient meet 2 or more of the SIRS criteria? No  MEWS guidelines implemented  No, previously yellow, continue vital signs every 4 hours  Notify: Charge Nurse/RN  Name of Charge Nurse/RN Notified Ale, RN  Provider Notification  Provider Name/Title Dr. Allena Katz  Date Provider Notified 07/30/23  Time Provider Notified [phone]  Method of Notification Face-to-face (MD at bedside)  Notification Reason Other (Comment) (Elevated BP)  Provider response No new orders  Date of Provider Response 07/30/23  Time of Provider Response 0857  Assess: SIRS CRITERIA  SIRS Temperature  0  SIRS Pulse 0  SIRS Respirations  0  SIRS WBC 0  SIRS Score Sum  0

## 2023-07-30 NOTE — Progress Notes (Signed)
[facility]  - [place] at [facility]   PATIENT NAME: [name]    MR#:  [id number]  DATE OF BIRTH:  1946/05/25  SUBJECTIVE:  No family at bedside.  No new complaints BP up this am VITALS:  Blood pressure 129/79, pulse 83, temperature 98.1 F (36.7 C), temperature source Oral, resp. rate 16, height 5\' 3"  (1.6 m), weight 56.7 kg, SpO2 98%.  PHYSICAL EXAMINATION:   GENERAL:  77 y.o.-year-old patient with no acute distress.  LUNGS: Normal breath sounds bilaterally, no wheezing CARDIOVASCULAR: S1, S2 normal.  ABDOMEN: Soft, nontender, nondistended.  EXTREMITIES: No  edema b/l.    NEUROLOGIC: nonfocal  patient is alert and awake  LABORATORY PANEL:  CBC Recent Labs  Lab 07/29/23 0453  WBC 11.6*  HGB 11.1*  HCT 31.5*  PLT 268    Chemistries  Recent Labs  Lab 07/29/23 0453 07/30/23 0258  NA 131* 135  K 3.8 3.2*  CL 100 92*  CO2 22 28  GLUCOSE 98 196*  BUN 17 23  CREATININE 0.74 0.78  CALCIUM 8.3* 8.4*  MG 2.0 2.2  AST 257*  --   ALT 247*  --   ALKPHOS 383*  --   BILITOT 5.5*  --    Cardiac Enzymes No results for input(s): "TROPONINI" in the last 168 hours. RADIOLOGY:  MR ABDOMEN MRCP W WO CONTAST  Result Date: 07/29/2023 CLINICAL DATA:  Infiltrative hepatic mass. MRI recommended for further characterization. EXAM: MRI ABDOMEN WITHOUT AND WITH CONTRAST (INCLUDING MRCP) TECHNIQUE: Multiplanar multisequence MR imaging of the abdomen was performed both before and after the administration of intravenous contrast. Heavily T2-weighted images of the biliary and pancreatic ducts were obtained, and three-dimensional MRCP images were rendered by post processing. CONTRAST:  6mL GADAVIST GADOBUTROL 1 MMOL/ML IV SOLN COMPARISON:  CT 07/26/2024 FINDINGS: Exam is degraded by patient respiratory motion. Lower chest:  Lung bases are clear. Hepatobiliary: Lobular mass in the central liver (segment 4A) is well-defined on diffusion-weighted imaging measuring  4.2 x 4.1 cm (image 76/series 9. No additional hepatic lesions identified. The mass is hypoenhancing (series 19). There is associated intrahepatic biliary duct dilatation involving the LEFT and RIGHT hepatic ductal systems. Ductal dilatation extends to the porta hepatis with abrupt narrowing of the ducks (image 46/13. The common hepatic duct is difficult to follow. The common bile duct is normal caliber at 5 mm. Gallbladder self appears normal. Pancreas: No pancreatic lesion no duct dilatation. Exam exam is degraded by respiratory motion. Spleen: Normal spleen. Adrenals/urinary tract: Adrenal glands difficult to evaluate due to motion degradation. Favor hyperplasia or adrenal adenomas. Stomach/Bowel: Stomach and limited of the small bowel is unremarkable Vascular/Lymphatic: Abdominal aortic normal caliber. No retroperitoneal periportal lymphadenopathy. Musculoskeletal: No aggressive osseous lesion IMPRESSION: 1. Exam is degraded by patient respiratory motion. Little information added to the CT interpretation. 2. Hypoenhancing mass centered in the central LEFT hepatic lobe (segment 4A) obstructs the LEFT and RIGHT intrahepatic ductal systems at the confluence (Klatskin type obstruction). 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 3. No additional sites metastatic disease the liver. 4. Adrenal glands difficult evaluation due to respiratory motion. Favor adrenal adenomas or hyperplasia. Electronically Signed   By: Genevive Bi M.D.   On: 07/29/2023 15:52   MR 3D Recon At Scanner  Result Date: 07/29/2023 CLINICAL DATA:  Infiltrative hepatic mass. MRI recommended for further characterization. EXAM: MRI ABDOMEN WITHOUT AND WITH CONTRAST (INCLUDING MRCP) TECHNIQUE: Multiplanar multisequence MR imaging of the abdomen was performed both before and after the administration  of intravenous contrast. Heavily T2-weighted images of the biliary and pancreatic ducts were obtained, and three-dimensional MRCP  images were rendered by post processing. CONTRAST:  6mL GADAVIST GADOBUTROL 1 MMOL/ML IV SOLN COMPARISON:  CT 07/26/2024 FINDINGS: Exam is degraded by patient respiratory motion. Lower chest:  Lung bases are clear. Hepatobiliary: Lobular mass in the central liver (segment 4A) is well-defined on diffusion-weighted imaging measuring 4.2 x 4.1 cm (image 76/series 9. No additional hepatic lesions identified. The mass is hypoenhancing (series 19). There is associated intrahepatic biliary duct dilatation involving the LEFT and RIGHT hepatic ductal systems. Ductal dilatation extends to the porta hepatis with abrupt narrowing of the ducks (image 46/13. The common hepatic duct is difficult to follow. The common bile duct is normal caliber at 5 mm. Gallbladder self appears normal. Pancreas: No pancreatic lesion no duct dilatation. Exam exam is degraded by respiratory motion. Spleen: Normal spleen. Adrenals/urinary tract: Adrenal glands difficult to evaluate due to motion degradation. Favor hyperplasia or adrenal adenomas. Stomach/Bowel: Stomach and limited of the small bowel is unremarkable Vascular/Lymphatic: Abdominal aortic normal caliber. No retroperitoneal periportal lymphadenopathy. Musculoskeletal: No aggressive osseous lesion IMPRESSION: 1. Exam is degraded by patient respiratory motion. Little information added to the CT interpretation. 2. Hypoenhancing mass centered in the central LEFT hepatic lobe (segment 4A) obstructs the LEFT and RIGHT intrahepatic ductal systems at the confluence (Klatskin type obstruction). 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 3. No additional sites metastatic disease the liver. 4. Adrenal glands difficult evaluation due to respiratory motion. Favor adrenal adenomas or hyperplasia. Electronically Signed   By: Genevive Bi M.D.   On: 07/29/2023 15:52   CT CHEST W CONTRAST  Result Date: 07/29/2023 CLINICAL DATA:  77 year old female with history of unintended weight  loss. EXAM: CT CHEST WITH CONTRAST TECHNIQUE: Multidetector CT imaging of the chest was performed during intravenous contrast administration. RADIATION DOSE REDUCTION: This exam was performed according to the departmental dose-optimization program which includes automated exposure control, adjustment of the mA and/or kV according to patient size and/or use of iterative reconstruction technique. CONTRAST:  75mL OMNIPAQUE IOHEXOL 300 MG/ML  SOLN COMPARISON:  No priors. FINDINGS: Cardiovascular: Heart size is normal. There is no significant pericardial fluid, thickening or pericardial calcification. There is aortic atherosclerosis, as well as atherosclerosis of the great vessels of the mediastinum and the coronary arteries, including calcified atherosclerotic plaque in the left anterior descending, left circumflex and right coronary arteries. Calcifications of the aortic valve. Calcifications of the mitral annulus. Mediastinum/Nodes: No pathologically enlarged mediastinal or hilar lymph nodes. Small hiatal hernia. No axillary lymphadenopathy. Lungs/Pleura: Bilateral apical nodular pleuroparenchymal thickening and architectural distortion is noted, most compatible with areas of chronic post infectious or inflammatory scarring. A few scattered tiny 1-3 mm pulmonary nodules are noted throughout the lungs bilaterally, nonspecific. No other larger more suspicious appearing pulmonary nodules or masses are noted. No acute consolidative airspace disease. No pleural effusions. Upper Abdomen: Poorly defined mass-like area in the central liver estimated to measure 4.8 x 3.5 cm, but incompletely imaged (axial image 154 of series 2). Severe intrahepatic biliary ductal dilatation also noted. Atherosclerotic calcifications in the abdominal aorta. Both adrenal glands are enlarged, with dominant nodule in the left adrenal gland measuring up to 2.4 x 1.9 cm. Musculoskeletal: There are no aggressive appearing lytic or blastic lesions  noted in the visualized portions of the skeleton. IMPRESSION: 1. Multiple tiny 1-3 mm pulmonary nodules, nonspecific, but statistically likely benign. No follow-up needed if patient is low-risk (and has no known or suspected  primary neoplasm). Non-contrast chest CT can be considered in 12 months if patient is high-risk. This recommendation follows the consensus statement: Guidelines for Management of Incidental Pulmonary Nodules Detected on CT Images: From the Fleischner Society 2017; Radiology 2017; 284:228-243. 2. Abdominal findings strongly suggestive of underlying malignancy, partially imaged, as above. As previously suggested, further evaluation with MRI of the abdomen with and without IV gadolinium is recommended to better evaluate these findings. 3. Aortic atherosclerosis, in addition to three-vessel coronary artery disease. Assessment for potential risk factor modification, dietary therapy or pharmacologic therapy may be warranted, if clinically indicated. 4. There are calcifications of the aortic valve and mitral annulus. Echocardiographic correlation for evaluation of potential valvular dysfunction may be warranted if clinically indicated. Aortic Atherosclerosis (ICD10-I70.0). Electronically Signed   By: Trudie Reed M.D.   On: 07/29/2023 06:56    Assessment and Plan  77 year old female with history of hypertension, non-insulin-dependent diabetes mellitus, hyperlipidemia, history of tobacco use, who presents to the emergency department for chief concerns of abdominal pain   Elevated LFTs Liver mass obstructive jaundice -- MRI notable for infiltrative cholangiocarcinoma Blood cultures x 2 neg thus far -- rising bilirubin 3.3--- 5.1 -- discussed with G.I. and oncology Dr. Cathie Hoops--- recommends percutaneous biliary drain with biopsy of liver mass --CA 19-9 1935 -- d/w Dr Miles Costain and will get PTC biliary drain and liver mass bx tomorrow   Leukocytosis --Differentials include sepsis although doubt  it -- sepsis ruled out --Blood cultures x 2 are neg thus far --patient was on broad-spectrum cefepime with metronidazole 500 mg IV twice daily-- discontinue since etiology is highly likely cancer   AKI (acute kidney injury) (HCC) Resolved with hydration   Diabetes mellitus type 2, noninsulin dependent (HCC) Insulin SSI with at bedtime coverage ordered Home metformin and semaglutide not resumed on admission   Hyperlipidemia Home atorvastatin  not resumed on admission due to elevated LFTs   Essential hypertension -- resume lisinopril  Hypokalemia potassium 3.8   Acute delirium/agitation Haldol prn -- improved. Patient much calm   GOC Palliative care c/s, overall poor prognosis      Procedures: Family communication :none today consults : G.I., oncology CODE STATUS: full DVT Prophylaxis : heparin Level of care: Telemetry Medical Status is: Inpatient Remains inpatient appropriate because: workup for liver mass    TOTAL TIME TAKING CARE OF THIS PATIENT: 35 minutes.  >50% time spent on counselling and coordination of care  Note: This dictation was prepared with Dragon dictation along with smaller phrase technology. Any transcriptional errors that result from this process are unintentional.  Enedina Finner M.D    [facility]   CC: Primary care physician; Rayetta Humphrey, MD

## 2023-07-31 ENCOUNTER — Inpatient Hospital Stay: Payer: Medicare HMO | Admitting: Radiology

## 2023-07-31 DIAGNOSIS — R160 Hepatomegaly, not elsewhere classified: Secondary | ICD-10-CM | POA: Diagnosis not present

## 2023-07-31 DIAGNOSIS — R7989 Other specified abnormal findings of blood chemistry: Secondary | ICD-10-CM | POA: Diagnosis not present

## 2023-07-31 DIAGNOSIS — D72829 Elevated white blood cell count, unspecified: Secondary | ICD-10-CM | POA: Diagnosis not present

## 2023-07-31 HISTORY — PX: IR BILIARY DRAIN PLACEMENT WITH CHOLANGIOGRAM: IMG6043

## 2023-07-31 HISTORY — PX: IR US LIVER BIOPSY: IMG936

## 2023-07-31 LAB — BASIC METABOLIC PANEL
Anion gap: 12 (ref 5–15)
BUN: 33 mg/dL — ABNORMAL HIGH (ref 8–23)
CO2: 24 mmol/L (ref 22–32)
Calcium: 8.1 mg/dL — ABNORMAL LOW (ref 8.9–10.3)
Chloride: 96 mmol/L — ABNORMAL LOW (ref 98–111)
Creatinine, Ser: 0.85 mg/dL (ref 0.44–1.00)
GFR, Estimated: >60 mL/min (ref >60)
Glucose, Bld: 234 mg/dL — ABNORMAL HIGH (ref 70–99)
Potassium: 3.9 mmol/L (ref 3.5–5.1)
Sodium: 132 mmol/L — ABNORMAL LOW (ref 135–145)

## 2023-07-31 LAB — GLUCOSE, CAPILLARY
Glucose-Capillary: 211 mg/dL — ABNORMAL HIGH (ref 70–99)
Glucose-Capillary: 220 mg/dL — ABNORMAL HIGH (ref 70–99)
Glucose-Capillary: 218 mg/dL — ABNORMAL HIGH (ref 70–99)

## 2023-07-31 LAB — HEPATIC FUNCTION PANEL
ALT: 215 U/L — ABNORMAL HIGH (ref 0–44)
AST: 200 U/L — ABNORMAL HIGH (ref 15–41)
Albumin: 2.6 g/dL — ABNORMAL LOW (ref 3.5–5.0)
Alkaline Phosphatase: 450 U/L — ABNORMAL HIGH (ref 38–126)
Bilirubin, Direct: 5.5 mg/dL — ABNORMAL HIGH (ref 0.0–0.2)
Indirect Bilirubin: 3.3 mg/dL — ABNORMAL HIGH (ref 0.3–0.9)
Total Bilirubin: 8.8 mg/dL — ABNORMAL HIGH (ref <1.2)
Total Protein: 5.2 g/dL — ABNORMAL LOW (ref 6.5–8.1)

## 2023-07-31 MED ORDER — FENTANYL CITRATE (PF) 100 MCG/2ML IJ SOLN
INTRAMUSCULAR | Status: AC | PRN
  Administered 2023-07-31 (×3): 50 ug via INTRAVENOUS

## 2023-07-31 MED ORDER — MIDAZOLAM HCL 2 MG/2ML IJ SOLN
INTRAMUSCULAR | Status: AC
Start: 2023-07-31 — End: ?
  Filled 2023-07-31: qty 4

## 2023-07-31 MED ORDER — MIDAZOLAM HCL 2 MG/2ML IJ SOLN
INTRAMUSCULAR | Status: AC | PRN
  Administered 2023-07-31: 1 mg via INTRAVENOUS

## 2023-07-31 MED ORDER — IOHEXOL 300 MG/ML  SOLN
7.0000 mL | Freq: Once | INTRAMUSCULAR | Status: AC | PRN
  Administered 2023-07-31: 7 mL

## 2023-07-31 MED ORDER — LIDOCAINE 1 % OPTIME INJ - NO CHARGE
7.0000 mL | Freq: Once | INTRAMUSCULAR | Status: AC
  Administered 2023-07-31: 7 mL via INTRADERMAL

## 2023-07-31 MED ORDER — METFORMIN HCL ER 750 MG PO TB24
750.0000 mg | ORAL_TABLET | Freq: Every day | ORAL | Status: DC
  Administered 2023-07-31: 750 mg via ORAL
  Filled 2023-07-31: qty 1

## 2023-07-31 MED ORDER — LIDOCAINE HCL 1 % IJ SOLN
4.0000 mL | Freq: Once | INTRAMUSCULAR | Status: AC
  Administered 2023-07-31: 4 mL via INTRADERMAL
  Filled 2023-07-31: qty 4

## 2023-07-31 MED ORDER — TRAMADOL HCL 50 MG PO TABS
50.0000 mg | ORAL_TABLET | Freq: Four times a day (QID) | ORAL | Status: DC | PRN
  Administered 2023-07-31 – 2023-08-01 (×2): 50 mg via ORAL
  Filled 2023-07-31 (×2): qty 1

## 2023-07-31 MED ORDER — SODIUM CHLORIDE 0.9 % IV SOLN
2.0000 g | Freq: Once | INTRAVENOUS | Status: AC
  Administered 2023-07-31 (×2): 2 g via INTRAVENOUS
  Filled 2023-07-31 (×2): qty 2

## 2023-07-31 MED ORDER — FENTANYL CITRATE (PF) 100 MCG/2ML IJ SOLN
INTRAMUSCULAR | Status: AC
  Filled 2023-07-31: qty 2

## 2023-07-31 MED ORDER — LIDOCAINE HCL 1 % IJ SOLN
INTRAMUSCULAR | Status: AC
  Filled 2023-07-31: qty 20

## 2023-07-31 MED ORDER — SODIUM CHLORIDE 0.9% FLUSH
5.0000 mL | Freq: Three times a day (TID) | INTRAVENOUS | Status: DC
  Administered 2023-07-31 – 2023-08-01 (×4): 5 mL

## 2023-07-31 MED ORDER — MIDAZOLAM HCL 5 MG/5ML IJ SOLN
INTRAMUSCULAR | Status: AC | PRN
  Administered 2023-07-31: 1 mg via INTRAVENOUS

## 2023-07-31 NOTE — Progress Notes (Signed)
                                                     Palliative Care Progress Note   Patient Name: [name]       Date: 07/31/2023 DOB: 1945/12/21  Age: 78 y.o. MRN#: [id number] Attending Physician: Enedina Finner, MD Primary Care Physician: Rayetta Humphrey, MD Admit Date: 07/27/2023  Extensive chart review completed prior to meeting patient including labs, vital signs, imaging, progress notes, orders, and available advanced directive documents from current and previous encounters.   Attempted to see patient in the room.  Patient was off the floor.  I spoke with patient's dayshift RN by uncle who confirmed patient is currently getting a biopsy and potentially a biliary drain.  I discussed previous goals of care discussion with patient and her family.  The patient's husband was adamant that she be a DNR and DNI, patient would not confirm with me.  RN confirms that patient has not been saying much and has not addressed her CODE STATUS either.  Full code and full scope remain.  PMT will continue to follow and monitor patient throughout her hospitalization.  Of note, there is a reduced staff for PMT over the weekend.  Please call with acute palliative issues and utlizize Amion for available providers.  Otherwise, I plan to follow-up with patient and family upon my return on Tuesday.  Should patient discharge prior to that time, outpatient palliative services have been placed and NP Josh Borders made aware of patient's new cancer diagnosis.  Plan is for Josh to follow once patient is established with cancer center.  Thank you for allowing the Palliative Medicine Team to assist in the care of [name].  Samara Deist L. Bonita Quin, DNP, FNP-BC Palliative Medicine Team    No charge

## 2023-07-31 NOTE — Inpatient Diabetes Management (Signed)
Inpatient Diabetes Program Recommendations  AACE/ADA: New Consensus Statement on Inpatient Glycemic Control   Target Ranges:  Prepandial:   less than 140 mg/dL      Peak postprandial:   less than 180 mg/dL (1-2 hours)      Critically ill patients:  140 - 180 mg/dL    Latest Reference Range & Units 07/30/23 08:15 07/30/23 12:30 07/30/23 16:57 07/30/23 21:41 07/31/23 07:50  Glucose-Capillary 70 - 99 mg/dL 782 (H) 956 (H) 213 (H) 193 (H) 211 (H)   Review of Glycemic Control  Diabetes history: DM2 Outpatient Diabetes medications: Metformin XR 750 mg QPM, Ozempic 0.5 mg Qweek (Wednesday) Current orders for Inpatient glycemic control: Novolog 0-9 units TID with meals, Novolog 0-5 units QHS  Inpatient Diabetes Program Recommendations:    Insulin: May want to consider ordering Semglee 5 units Q24H.  Thanks, Orlando Penner, RN, MSN, CDCES Diabetes Coordinator Inpatient Diabetes Program [phone] (Team Pager from 8am to 5pm)

## 2023-07-31 NOTE — Consult Note (Signed)
PHARMACY CONSULT NOTE - ELECTROLYTES  Pharmacy Consult for Electrolyte Monitoring and Replacement   Recent Labs: Potassium (mmol/L)  Date Value  07/31/2023 3.9  04/22/2012 3.7   Magnesium (mg/dL)  Date Value  14/78/2956 2.2  04/22/2012 1.9   Calcium (mg/dL)  Date Value  21/30/8657 8.1 (L)   Calcium, Total (mg/dL)  Date Value  84/69/6295 8.7   Albumin (g/dL)  Date Value  28/41/3244 2.6 (L)  04/21/2012 3.8   Phosphorus (mg/dL)  Date Value  09/03/7251 2.5   Sodium (mmol/L)  Date Value  07/31/2023 132 (L)  04/22/2012 144   Height: 5\' 3"  (160 cm) Weight: 56.7 kg (125 lb) IBW/kg (Calculated) : 52.4 Estimated Creatinine Clearance: 45.9 mL/min (by C-G formula based on SCr of 0.85 mg/dL).  Assessment  [name] is a 77 y.o. female presenting with abdominal pain with subsequent findings concerning for liver mass and malignant process. PMH significant for hypertension, non-insulin-dependent diabetes mellitus, hyperlipidemia, history of tobacco use. Pharmacy has been consulted to monitor and replace electrolytes.  Diet: heart healthy/carb modified MIVF: N/A Pertinent medications: N/A  Goal of Therapy: Electrolytes within normal limits  Plan:  No electrolyte replacement indicated at this time Defer electrolytes for tomorrow AM. Consider checking [phone]  Thank you for allowing pharmacy to be a part of this patient's care.  Tressie Ellis 07/31/2023 7:04 AM

## 2023-07-31 NOTE — Progress Notes (Signed)
[facility]  -  at [facility]   PATIENT NAME: [name]    MR#:  [id number]  DATE OF BIRTH:  08-31-1946  SUBJECTIVE:  Husband at bedside.  No new complaints For IR procedure today VITALS:  Blood pressure (!) 168/69, pulse 76, temperature 98.3 F (36.8 C), temperature source Oral, resp. rate 14, height 5\' 3"  (1.6 m), weight 56.7 kg, SpO2 97%.  PHYSICAL EXAMINATION:   GENERAL:  77 y.o.-year-old patient with no acute distress.  LUNGS: Normal breath sounds bilaterally, no wheezing CARDIOVASCULAR: S1, S2 normal.  ABDOMEN: Soft, nontender, nondistended.  EXTREMITIES: No  edema b/l.    NEUROLOGIC: nonfocal  patient is alert and awake  LABORATORY PANEL:  CBC Recent Labs  Lab 07/29/23 0453  WBC 11.6*  HGB 11.1*  HCT 31.5*  PLT 268    Chemistries  Recent Labs  Lab 07/30/23 0258 07/31/23 0532  NA 135 132*  K 3.2* 3.9  CL 92* 96*  CO2 28 24  GLUCOSE 196* 234*  BUN 23 33*  CREATININE 0.78 0.85  CALCIUM 8.4* 8.1*  MG 2.2  --   AST  --  200*  ALT  --  215*  ALKPHOS  --  450*  BILITOT  --  8.8*   Cardiac Enzymes No results for input(s): "TROPONINI" in the last 168 hours. RADIOLOGY:  MR ABDOMEN MRCP W WO CONTAST  Result Date: 07/29/2023 CLINICAL DATA:  Infiltrative hepatic mass. MRI recommended for further characterization. EXAM: MRI ABDOMEN WITHOUT AND WITH CONTRAST (INCLUDING MRCP) TECHNIQUE: Multiplanar multisequence MR imaging of the abdomen was performed both before and after the administration of intravenous contrast. Heavily T2-weighted images of the biliary and pancreatic ducts were obtained, and three-dimensional MRCP images were rendered by post processing. CONTRAST:  6mL GADAVIST GADOBUTROL 1 MMOL/ML IV SOLN COMPARISON:  CT 07/26/2024 FINDINGS: Exam is degraded by patient respiratory motion. Lower chest:  Lung bases are clear. Hepatobiliary: Lobular mass in the central liver (segment 4A) is well-defined on diffusion-weighted  imaging measuring 4.2 x 4.1 cm (image 76/series 9. No additional hepatic lesions identified. The mass is hypoenhancing (series 19). There is associated intrahepatic biliary duct dilatation involving the LEFT and RIGHT hepatic ductal systems. Ductal dilatation extends to the porta hepatis with abrupt narrowing of the ducks (image 46/13. The common hepatic duct is difficult to follow. The common bile duct is normal caliber at 5 mm. Gallbladder self appears normal. Pancreas: No pancreatic lesion no duct dilatation. Exam exam is degraded by respiratory motion. Spleen: Normal spleen. Adrenals/urinary tract: Adrenal glands difficult to evaluate due to motion degradation. Favor hyperplasia or adrenal adenomas. Stomach/Bowel: Stomach and limited of the small bowel is unremarkable Vascular/Lymphatic: Abdominal aortic normal caliber. No retroperitoneal periportal lymphadenopathy. Musculoskeletal: No aggressive osseous lesion IMPRESSION: 1. Exam is degraded by patient respiratory motion. Little information added to the CT interpretation. 2. Hypoenhancing mass centered in the central LEFT hepatic lobe (segment 4A) obstructs the LEFT and RIGHT intrahepatic ductal systems at the confluence (Klatskin type obstruction). 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 3. No additional sites metastatic disease the liver. 4. Adrenal glands difficult evaluation due to respiratory motion. Favor adrenal adenomas or hyperplasia. Electronically Signed   By: Genevive Bi M.D.   On: 07/29/2023 15:52   MR 3D Recon At Scanner  Result Date: 07/29/2023 CLINICAL DATA:  Infiltrative hepatic mass. MRI recommended for further characterization. EXAM: MRI ABDOMEN WITHOUT AND WITH CONTRAST (INCLUDING MRCP) TECHNIQUE: Multiplanar multisequence MR imaging of the abdomen was performed both before and after  the administration of intravenous contrast. Heavily T2-weighted images of the biliary and pancreatic ducts were obtained, and  three-dimensional MRCP images were rendered by post processing. CONTRAST:  6mL GADAVIST GADOBUTROL 1 MMOL/ML IV SOLN COMPARISON:  CT 07/26/2024 FINDINGS: Exam is degraded by patient respiratory motion. Lower chest:  Lung bases are clear. Hepatobiliary: Lobular mass in the central liver (segment 4A) is well-defined on diffusion-weighted imaging measuring 4.2 x 4.1 cm (image 76/series 9. No additional hepatic lesions identified. The mass is hypoenhancing (series 19). There is associated intrahepatic biliary duct dilatation involving the LEFT and RIGHT hepatic ductal systems. Ductal dilatation extends to the porta hepatis with abrupt narrowing of the ducks (image 46/13. The common hepatic duct is difficult to follow. The common bile duct is normal caliber at 5 mm. Gallbladder self appears normal. Pancreas: No pancreatic lesion no duct dilatation. Exam exam is degraded by respiratory motion. Spleen: Normal spleen. Adrenals/urinary tract: Adrenal glands difficult to evaluate due to motion degradation. Favor hyperplasia or adrenal adenomas. Stomach/Bowel: Stomach and limited of the small bowel is unremarkable Vascular/Lymphatic: Abdominal aortic normal caliber. No retroperitoneal periportal lymphadenopathy. Musculoskeletal: No aggressive osseous lesion IMPRESSION: 1. Exam is degraded by patient respiratory motion. Little information added to the CT interpretation. 2. Hypoenhancing mass centered in the central LEFT hepatic lobe (segment 4A) obstructs the LEFT and RIGHT intrahepatic ductal systems at the confluence (Klatskin type obstruction). 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 3. No additional sites metastatic disease the liver. 4. Adrenal glands difficult evaluation due to respiratory motion. Favor adrenal adenomas or hyperplasia. Electronically Signed   By: Genevive Bi M.D.   On: 07/29/2023 15:52    Assessment and Plan  77 year old female with history of hypertension, non-insulin-dependent  diabetes mellitus, hyperlipidemia, history of tobacco use, who presents to the emergency department for chief concerns of abdominal pain   Elevated LFTs Liver mass obstructive jaundice -- MRI notable for infiltrative cholangiocarcinoma Blood cultures x 2 neg thus far -- rising bilirubin 3.3--- 5.1 -- discussed with G.I. and oncology Dr. Cathie Hoops--- recommends percutaneous biliary drain with biopsy of liver mass --CA 19-9 1935 -- d/w Dr Elby Showers  and will get PTC biliary drain and liver mass bx today   Leukocytosis --Differentials include sepsis although doubt it -- sepsis ruled out --Blood cultures x 2 are neg thus far --patient was on broad-spectrum cefepime with metronidazole 500 mg IV twice daily-- discontinue since etiology is highly likely cancer   AKI (acute kidney injury) (HCC) --Resolved with hydration   Diabetes mellitus type 2, noninsulin dependent (HCC) --insulin SSI with at bedtime coverage ordered --Home metformin and semaglutide not resumed on admission --will resume metformin since sugars are up   Hyperlipidemia Home atorvastatin  not resumed on admission due to elevated LFTs   Essential hypertension -- resume lisinopril  Hypokalemia potassium 3.8   Acute delirium/agitation -- improved. Patient much calm   GOC Palliative care c/s, overall poor prognosis      Procedures: Family communication :husband at bedside consults : G.I., oncology CODE STATUS: full DVT Prophylaxis : heparin Level of care: Telemetry Medical Status is: Inpatient Remains inpatient appropriate because: workup for liver mass    TOTAL TIME TAKING CARE OF THIS PATIENT: 35 minutes.  >50% time spent on counselling and coordination of care  Note: This dictation was prepared with Dragon dictation along with smaller phrase technology. Any transcriptional errors that result from this process are unintentional.  Enedina Finner M.D    [facility]   CC: Primary care physician; Greggory Stallion,  Marylin Crosby, MD

## 2023-07-31 NOTE — Plan of Care (Signed)
  Problem: Coping: Goal: Ability to adjust to condition or change in health will improve Outcome: Progressing   Problem: Fluid Volume: Goal: Ability to maintain a balanced intake and output will improve Outcome: Progressing

## 2023-07-31 NOTE — Procedures (Signed)
Interventional Radiology Procedure Note  Procedure:  1) Ultrasound guided liver mass biopsy 2) Biliary drain placement  Findings: Please refer to procedural dictation for full description. 18 ga core bx x1 from ill-defined right lobe liver mass.  10 Fr right sided internal/external biliary drain placed, to bag drainage.  Complications: None immediate  Estimated Blood Loss: < 5 mL  Recommendations: Follow up Pathology results.  If inconclusive, would recommend repeat attempt with CT guidance. Keep drain to bag drainage.  IR will follow.   Marliss Coots, MD

## 2023-07-31 NOTE — Consult Note (Signed)
Chief Complaint: Patient was seen in consultation today for hilar mass with biliary obstruction  Referring Physician(s): Enedina Finner, MD  History of Present Illness: [name] is a 77 y.o. female who presented to the [facility] ED on 07/27/23 with 4 days of abdominal pain.  She was found to have leukocytosis and hyperbilirubinemia which prompted imaging studies revealing bilateral bilary ductal dilation and right lobe peri-hilar mass.  Today she states she feels bad with some abdominal pain.  No nausea/vomiting.  No fevers or chills. Her husband is at the bedside.  Past Medical History:  Diagnosis Date   Carcinoma of vaginal vault (HCC)    s/p Hysterectomy with vaginal resection   Diabetes mellitus type 2, controlled (HCC)    Essential hypertension    H/O: CVA (cerebrovascular accident) 04/21/2012   inferior posterior limb of the internal capsule, also tiny punctate acute infarct in the posterior aspect of the right parietal lobe   Hyperlipidemia    Status post cataract surgery     Past Surgical History:  Procedure Laterality Date   ABDOMINAL HYSTERECTOMY     vaginal resection   BREAST BIOPSY Left    bx/clip-neg    Allergies: Ezetimibe, Simvastatin, Niacin, and Pravastatin  Medications: Prior to Admission medications   Medication Sig Start Date End Date Taking? Authorizing Provider  aspirin 81 MG chewable tablet Chew 81 mg by mouth daily with supper.   Yes [provider]  atorvastatin (LIPITOR) 40 MG tablet Take 40 mg by mouth daily after supper.   Yes [provider]  lisinopril (ZESTRIL) 20 MG tablet Take 1 tablet by mouth daily with supper. 09/21/19  Yes [provider]  metFORMIN (GLUCOPHAGE-XR) 750 MG 24 hr tablet Take 750 mg by mouth daily with supper.   Yes [provider]  Semaglutide,0.25 or 0.5MG /DOS, 2 MG/3ML SOPN Inject 0.5 mg into the skin once a week. On Wednesday 04/07/23  Yes [provider]  baclofen  5 MG TABS Take 5 mg by mouth at bedtime as needed for muscle spasms. Patient not taking: Reported on 07/29/2023 09/25/21   Valinda Hoar, NP  naproxen (NAPROSYN) 500 MG tablet Take 1 tablet (500 mg total) by mouth 2 (two) times daily. Patient not taking: Reported on 07/29/2023 09/25/21   Valinda Hoar, NP     Family History  Problem Relation Age of Onset   Peripheral vascular disease Father    Heart disease Mother    Breast cancer Neg Hx     Social History   Socioeconomic History   Marital status: Married    Spouse name: Not on file   Number of children: Not on file   Years of education: Not on file   Highest education level: Not on file  Occupational History   Not on file  Tobacco Use   Smoking status: Every Day    Current packs/day: 0.25    Types: Cigarettes   Smokeless tobacco: Never  Vaping Use   Vaping status: Never Used  Substance and Sexual Activity   Alcohol use: No   Drug use: No   Sexual activity: Not Currently  Other Topics Concern   Not on file  Social History Narrative   She lives at home with her husband. She  smokes about 1/2 pack per day, used to smoke more than 2 packs in the past. She started smoking when she was 77 years old. No history of any alcohol use or drug abuse. She currently works at [employer]  in [place]. She used to work in [employer] in the past.     Social Determinants of Health   Financial Resource Strain: Medium Risk (03/17/2019)   Received from [facility]   Overall Financial Resource Strain (CARDIA)    Difficulty of Paying Living Expenses: Somewhat hard  Food Insecurity: No Food Insecurity (07/28/2023)   Hunger Vital Sign    Worried About Running Out of Food in the Last Year: Never true    Ran Out of Food in the Last Year: Never true  Transportation Needs: No Transportation Needs (07/28/2023)   PRAPARE - Administrator, Civil Service (Medical): No    Lack of Transportation (Non-Medical): No   Physical Activity: Insufficiently Active (03/17/2019)   [facility]   Exercise Vital Sign    Days of Exercise per Week: 3 days    Minutes of Exercise per Session: 30 min  Stress: Not on file  Social Connections: Unknown (03/17/2019)   [facility] from [facility]   Social Connection and Isolation Panel [NHANES]    Frequency of Communication with Friends and Family: More than three times a week    Frequency of Social Gatherings with Friends and Family: More than three times a week    Attends Religious Services: Not on Marketing executive or Organizations: Not on file    Attends Banker Meetings: Not on file    Marital Status: Not on file    ECOG Status: 1 - Symptomatic but completely ambulatory  Review of Systems: A 12 point ROS discussed and pertinent positives are indicated in the HPI above.  All other systems are negative.  Vital Signs: BP (!) 146/68 (BP Location: Right Arm)   Pulse 73   Temp 98.2 F (36.8 C)   Resp 16   Ht 5\' 3"  (1.6 m)   Wt 56.7 kg   SpO2 97%   BMI 22.14 kg/m   Advance Care Plan: The advanced care plan/surrogate decision maker was discussed at the time of visit and documented in the medical record.    Physical Exam Constitutional:      General: She is not in acute distress. HENT:     Head: Normocephalic.     Mouth/Throat:     Mouth: Mucous membranes are moist.  Eyes:     General: Scleral icterus present.  Cardiovascular:     Rate and Rhythm: Normal rate.  Pulmonary:     Effort: Pulmonary effort is normal. No respiratory distress.  Abdominal:     General: There is no distension.  Musculoskeletal:        General: Swelling present.  Skin:    General: Skin is warm and dry.     Coloration: Skin is jaundiced.     Findings: Bruising present.  Neurological:     Mental Status: She is alert and oriented to person, place, and time.     Imaging: MRI abdomen  (07/29/23)    Labs:  CBC: Recent Labs    07/27/23 1920 07/28/23 0158 07/29/23 0453  WBC 14.4* 13.7*  13.6* 11.6*  HGB 11.9* 12.0  11.9* 11.1*  HCT 34.7* 34.8*  33.7* 31.5*  PLT 308 289  283 268    COAGS: Recent Labs    07/28/23 1204  INR 1.0  APTT 24    BMP: Recent Labs    07/28/23 0158 07/29/23 0453 07/30/23 0258 07/31/23 0532  NA 131* 131* 135 132*  K 3.0* 3.8 3.2* 3.9  CL 92* 100 92* 96*  CO2 28 22 28 24   GLUCOSE 238* 98 196* 234*  BUN 33* 17 23 33*  CALCIUM 8.4* 8.3* 8.4* 8.1*  CREATININE 0.98 0.74 0.78 0.85  GFRNONAA 59* >60 >60 >60    LIVER FUNCTION TESTS: Recent Labs    07/27/23 1920 07/28/23 0158 07/29/23 0453 07/31/23 0532  BILITOT 2.1* 3.3* 5.5* 8.8*  AST 162* 208* 257* 200*  ALT 269* 249* 247* 215*  ALKPHOS 446* 410* 383* 450*  PROT 6.7 5.9* 5.0* 5.2*  ALBUMIN 3.4* 3.2* 2.6* 2.6*    TUMOR MARKERS: No results for input(s): "AFPTM", "CEA", "CA199", "CHROMGRNA" in the last 8760 hours.  Assessment and Plan: 77 year old female presenting with abdominal pain found to have an indeterminate right lobe liver mass with associated biliary obstruction.  No current signs of overt cholangitis.  Plan for image guided percutaneous cholangiogram and biliary drain placement, possibly bilateral, with image guided liver mass biopsy with moderate sedation.  Risks and benefits discussed with the patient including bleeding, infection, damage to adjacent structures, bowel perforation/fistula connection, and sepsis.  Plan for IV cefoxitin prophylaxis.  All of the patient's questions were answered, patient is agreeable to proceed. Consent signed and in chart.     Electronically Signed: Bennie Dallas, MD 07/31/2023, 8:40 AM   I spent a total of 40 Minutes  in face to face in clinical consultation, greater than 50% of which was counseling/coordinating care for liver mass and biliary obstruction.

## 2023-08-01 DIAGNOSIS — R7989 Other specified abnormal findings of blood chemistry: Secondary | ICD-10-CM | POA: Diagnosis not present

## 2023-08-01 DIAGNOSIS — C801 Malignant (primary) neoplasm, unspecified: Secondary | ICD-10-CM

## 2023-08-01 DIAGNOSIS — E440 Moderate protein-calorie malnutrition: Secondary | ICD-10-CM | POA: Diagnosis not present

## 2023-08-01 DIAGNOSIS — R160 Hepatomegaly, not elsewhere classified: Secondary | ICD-10-CM | POA: Diagnosis not present

## 2023-08-01 DIAGNOSIS — D72829 Elevated white blood cell count, unspecified: Secondary | ICD-10-CM | POA: Diagnosis not present

## 2023-08-01 DIAGNOSIS — K831 Obstruction of bile duct: Secondary | ICD-10-CM

## 2023-08-01 LAB — HEPATIC FUNCTION PANEL
ALT: 169 U/L — ABNORMAL HIGH (ref 0–44)
AST: 70 U/L — ABNORMAL HIGH (ref 15–41)
Albumin: 2.8 g/dL — ABNORMAL LOW (ref 3.5–5.0)
Alkaline Phosphatase: 410 U/L — ABNORMAL HIGH (ref 38–126)
Bilirubin, Direct: 2.1 mg/dL — ABNORMAL HIGH (ref 0.0–0.2)
Indirect Bilirubin: 2.2 mg/dL — ABNORMAL HIGH (ref 0.3–0.9)
Total Bilirubin: 4.3 mg/dL — ABNORMAL HIGH (ref <1.2)
Total Protein: 5.7 g/dL — ABNORMAL LOW (ref 6.5–8.1)

## 2023-08-01 LAB — CULTURE, BLOOD (ROUTINE X 2)
Culture: NO GROWTH
Culture: NO GROWTH
Special Requests: ADEQUATE

## 2023-08-01 LAB — GLUCOSE, CAPILLARY
Glucose-Capillary: 278 mg/dL — ABNORMAL HIGH (ref 70–99)
Glucose-Capillary: 260 mg/dL — ABNORMAL HIGH (ref 70–99)
Glucose-Capillary: 267 mg/dL — ABNORMAL HIGH (ref 70–99)

## 2023-08-01 MED ORDER — TRAMADOL HCL 50 MG PO TABS: 50.0000 mg | ORAL_TABLET | Freq: Two times a day (BID) | ORAL | 0 refills | Status: AC | PRN

## 2023-08-01 MED ORDER — METFORMIN HCL ER 500 MG PO TB24
500.0000 mg | ORAL_TABLET | Freq: Two times a day (BID) | ORAL | Status: DC
  Filled 2023-08-01: qty 1

## 2023-08-01 NOTE — Progress Notes (Signed)
[facility]  - [place] [facility]   PATIENT NAME: [name]    MR#:  [id number]  DATE OF BIRTH:  09/08/1945  SUBJECTIVE:  Husband at bedside.  No new complaints other than feeling weak. Wants to go home For IR procedure today VITALS:  Blood pressure 125/64, pulse 72, temperature 97.6 F (36.4 C), resp. rate 20, height 5\' 3"  (1.6 m), weight 56.7 kg, SpO2 94%.  PHYSICAL EXAMINATION:   GENERAL:  77 y.o.-year-old patient with no acute distress.  LUNGS: Normal breath sounds bilaterally, no wheezing CARDIOVASCULAR: S1, S2 normal.  ABDOMEN: Soft, nontender, nondistended. Right UQ PTC drain + EXTREMITIES: No  edema b/l.    NEUROLOGIC: nonfocal  patient is alert and awake, weak  LABORATORY PANEL:  CBC Recent Labs  Lab 07/29/23 0453  WBC 11.6*  HGB 11.1*  HCT 31.5*  PLT 268    Chemistries  Recent Labs  Lab 07/30/23 0258 07/31/23 0532 08/01/23 1136  NA 135 132*  --   K 3.2* 3.9  --   CL 92* 96*  --   CO2 28 24  --   GLUCOSE 196* 234*  --   BUN 23 33*  --   CREATININE 0.78 0.85  --   CALCIUM 8.4* 8.1*  --   MG 2.2  --   --   AST  --  200* 70*  ALT  --  215* 169*  ALKPHOS  --  450* 410*  BILITOT  --  8.8* 4.3*    RADIOLOGY:  IR US LIVER BIOPSY  Result Date: 07/31/2023 INDICATION: 77 year old female with history of indeterminate liver mass and biliary obstruction. EXAM: LIVER CORE BIOPSY MEDICATIONS: None. ANESTHESIA/SEDATION: Moderate (conscious) sedation was employed during this procedure. A total of Versed 2 mg and Fentanyl 50 mcg was administered intravenously. Moderate Sedation Time: 37 minutes. The patient's level of consciousness and vital signs were monitored continuously by radiology nursing throughout the procedure under my direct supervision. COMPLICATIONS: None immediate. PROCEDURE: Informed written consent was obtained from the patient after a thorough discussion of the procedural risks, benefits and alternatives. All questions  were addressed. Maximal Sterile Barrier Technique was utilized including caps, mask, sterile gowns, sterile gloves, sterile drape, hand hygiene and skin antiseptic. A timeout was performed prior to the initiation of the procedure. Preprocedure ultrasound demonstrated an ill-defined, mildly hyperechoic approximately 3 cm mass in the anterior right lobe of the liver compatible with location of mass on recent comparison imaging. The right upper quadrant was prepped and draped in standard fashion. Local anesthesia was administered subdermally at the planned entry site as well as under ultrasound guidance along the hepatic capsule. A skin nick was made. A 17 gauge introducer needle was advanced to the hepatic parenchyma under ultrasound guidance to the periphery of the mass. Next, a total of 1, 18 gauge core biopsy was obtained. The samples were placed in formalin and sent to Pathology. The introducer needle was withdrawn. Postprocedure ultrasound demonstrated no evidence of perihepatic fluid collection. The patient tolerated the procedure well. IMPRESSION: Technically successful ultrasound-guided right lobe liver mass core biopsy. Marliss Coots, MD [facility] Radiology Electronically Signed   By: Marliss Coots M.D.   On: 07/31/2023 13:30   IR BILIARY DRAIN PLACEMENT WITH CHOLANGIOGRAM  Result Date: 07/31/2023 INDICATION: 77 year old female with history of obstructive liver mass. EXAM: 1. Percutaneous cholangiogram. 2. Percutaneous biliary drain placement. MEDICATIONS: Cefoxitin, 2 g, intravenous; The antibiotic was administered within an appropriate time frame prior  to the initiation of the procedure. ANESTHESIA/SEDATION: Moderate (conscious) sedation was employed during this procedure. A total of Versed 2 mg and Fentanyl 150 mcg was administered intravenously by the radiology nurse. Total intra-service moderate Sedation Time: 37 minutes. The patient's level of  consciousness and vital signs were monitored continuously by radiology nursing throughout the procedure under my direct supervision. FLUOROSCOPY: Radiation Exposure Index (as provided by the fluoroscopic device): 25 mGy Kerma COMPLICATIONS: None immediate. PROCEDURE: Informed written consent was obtained from the patient after a thorough discussion of the procedural risks, benefits and alternatives. All questions were addressed. Maximal Sterile Barrier Technique was utilized including caps, mask, sterile gowns, sterile gloves, sterile drape, hand hygiene and skin antiseptic. A timeout was performed prior to the initiation of the procedure. Preprocedure ultrasound was evaluation of the right lobe of the liver demonstrated moderate to severe diffuse biliary ductal dilation. The procedure was planned. Subdermal Local anesthesia was provided at the planned needle entry site with 1% lidocaine. A small skin nick was made. Under direct ultrasound visualization, a 21 gauge Chiba needle was directed into a right lateral biliary radicle duct. A 018 wire was inserted with ease to the hilum. The inner portion of an Accustick set was then introduced over the wire and the wire was removed. Percutaneous cholangiogram was performed which demonstrated obstruction at the level of the biliary hilum. The inner dilator was then exchanged for the full Accustick sheath through which a Glidewire was inserted unable to be passed beyond the obstruction. Therefore, a 5 [place], angled tip catheter was inserted over the wire and used to guide the wire with moderate difficulty into the duodenum. The catheter was advanced. The wire was removed. Contrast injection demonstrated intraluminal position. An Amplatz wire was then placed and after serial dilation, a 10 [place] skater biliary drain was then placed. The pigtail portion was formed in duodenum. Contrast injection demonstrated patency and good position with possible mild reflux into the  left-sided biliary tree. The catheter was affixed to the skin with an interrupted 0 silk suture. A sterile bandage was applied. The drain was placed to bag drainage. The patient tolerated the procedure well and was transferred back to the floor in good condition. IMPRESSION: 1. Hilar obstruction due to known mass. 2. Technically successful placement of a right-sided 10 [place] internal external biliary drain. 3. The left-sided bile ducts were not definitively visualized on this study. If the patient's hyperbilirubinemia does not resolve appropriately, left-sided biliary drain placement could be considered. Marliss Coots, MD [facility] Radiology Electronically Signed   By: Marliss Coots M.D.   On: 07/31/2023 13:30    Assessment and Plan  77 year old female with history of hypertension, non-insulin-dependent diabetes mellitus, hyperlipidemia, history of tobacco use, who presents to the emergency department for chief concerns of abdominal pain   Elevated LFTs Liver mass obstructive jaundice -- MRI notable for infiltrative cholangiocarcinoma Blood cultures x 2 neg thus far -- rising bilirubin 3.3--- 5.1 -- discussed with G.I. and oncology Dr. Cathie Hoops--- recommends percutaneous biliary drain with biopsy of liver mass --CA 19-9 1935 -- d/w Dr Elby Showers  and will get PTC biliary drain and liver mass bx today --11/30--s/p PTC biliary drain and liver mass bx --PT/OT to see pt   Leukocytosis --Differentials include sepsis although doubt it -- sepsis ruled out --Blood cultures x 2 are neg thus far --patient was on broad-spectrum cefepime with metronidazole 500 mg IV twice daily-- discontinue since etiology is highly likely cancer  AKI (acute kidney injury) (HCC) --Resolved with hydration   Diabetes mellitus type 2, noninsulin dependent (HCC) --insulin SSI with at bedtime coverage ordered --Home metformin and semaglutide not resumed on admission --will resume  metformin since sugars are up   Hyperlipidemia Home atorvastatin  not resumed on admission due to elevated LFTs   Essential hypertension -- resume lisinopril  Hypokalemia potassium 3.8   Acute delirium/agitation -- improved. Patient much calm   GOC Palliative care c/s, overall poor prognosis  PT OT to see patient. TOC for discharge planning. Discussed with husband wants therapy evaluation is given will work on discharge planning. Patient is eager to go home.      Procedures: Family communication :husband at bedside consults : G.I., oncology CODE STATUS: full DVT Prophylaxis : heparin Level of care: Telemetry Medical Status is: Inpatient Remains inpatient appropriate because: workup for liver mass    TOTAL TIME TAKING CARE OF THIS PATIENT: 35 minutes.  >50% time spent on counselling and coordination of care  Note: This dictation was prepared with Dragon dictation along with smaller phrase technology. Any transcriptional errors that result from this process are unintentional.  Enedina Finner M.D    [facility]   CC: Primary care physician; Rayetta Humphrey, MD

## 2023-08-01 NOTE — TOC Initial Note (Addendum)
Transition of Care [facility]) - Initial/Assessment Note    Patient Details  Name: [name] MRN: [id number] Date of Birth: 1946/04/12  Transition of [facility]) CM/SW Contact:    Liliana Cline, LCSW Phone Number: 08/01/2023, 3:24 PM  Clinical Narrative:   Patient to DC home. Needs RW and HH. Met with patient and spouse at bedside. Patient is from home with spouse. PCP is Dr. Greggory Stallion.  Patient and spouse agreeable to recs for [facility] and RW. No agency preferences. Referral made to [facility] with [facility]. Referral made to [facility] with [employer] for RW. Spouse to transport home at DC.                4:15- Called Kim with [employer] to request ETA for DME delivery. Per Selena Batten, the delivery has been assigned to a driver and should be at bedside within the hour.  Expected Discharge Plan: Home w Home Health Services Barriers to Discharge: Barriers Resolved   Patient Goals and CMS Choice Patient states their goals for this hospitalization and ongoing recovery are:: home with home health CMS Medicare.gov Compare Post Acute Care list provided to:: Patient Choice offered to / list presented to : Patient      Expected Discharge Plan and Services       Living arrangements for the past 2 months: Single Family Home Expected Discharge Date: 08/01/23               DME Arranged: Dan Humphreys rolling DME Agency: [employer] Date DME Agency Contacted: 08/01/23   Representative spoke with at DME Agency: Tawanna Cooler Arranged: PT HH Agency: [facility] Care Date [facility] Agency Contacted: 08/01/23   Representative spoke with at [facility] Agency: Kandee Keen  Prior Living Arrangements/Services Living arrangements for the past 2 months: Single Family Home Lives with:: Spouse Patient language and need for interpreter reviewed:: Yes Do you feel safe going back to the place where you live?: Yes      Need for Family Participation in Patient Care: Yes (Comment) Care giver support system in place?: Yes (comment)   Criminal  Activity/Legal Involvement Pertinent to Current Situation/Hospitalization: No - Comment as needed  Activities of Daily Living   ADL Screening (condition at time of admission) Independently performs ADLs?: Yes (appropriate for developmental age) Is the patient deaf or have difficulty hearing?: No Does the patient have difficulty seeing, even when wearing glasses/contacts?: No Does the patient have difficulty concentrating, remembering, or making decisions?: No  Permission Sought/Granted Permission sought to share information with : Facility Industrial/product designer granted to share information with : Yes, Verbal Permission Granted     Permission granted to share info w AGENCY: HH and DME agencies        Emotional Assessment         Alcohol / Substance Use: Not Applicable Psych Involvement: No (comment)  Admission diagnosis:  Cholangitis [K83.09] Ascending cholangitis [K83.09] Liver mass [R16.0] Right upper quadrant abdominal pain [R10.11] Nausea and vomiting, unspecified vomiting type [R11.2] Patient Active Problem List   Diagnosis Date Noted   Obstructive jaundice due to cancer (HCC) 08/01/2023   Moderate protein-calorie malnutrition (HCC) 08/01/2023   Right upper quadrant abdominal pain 07/28/2023   Hyponatremia 07/28/2023   Hypokalemia 07/27/2023   Elevated LFTs 07/27/2023   Essential hypertension 07/27/2023   Hyperlipidemia 07/27/2023   Diabetes mellitus type 2, noninsulin dependent (HCC) 07/27/2023   AKI (acute kidney injury) (HCC) 07/27/2023   Liver mass 07/27/2023   Leukocytosis 07/27/2023   PCP:  Rayetta Humphrey, MD Pharmacy:   [facility] [address], [place] - [address] [place] [address] Phone: [phone] Fax: [phone]  [id number], [place] - [address] AT [facility] [address] [facility] [place] [address] Phone: [phone] Fax: [phone]     Social  Determinants of Health (SDOH) Social History: SDOH Screenings   Food Insecurity: No Food Insecurity (07/28/2023)  Housing: Low Risk  (07/28/2023)  Transportation Needs: No Transportation Needs (07/28/2023)  Utilities: Not At Risk (07/28/2023)  Financial Resource Strain: Medium Risk (03/17/2019)   Received from [facility]  Physical Activity: Insufficiently Active (03/17/2019)   Received from [facility]  Social Connections: Unknown (03/17/2019)   Received from [facility]  Tobacco Use: High Risk (07/27/2023)   SDOH Interventions:     Readmission Risk Interventions     No data to display

## 2023-08-01 NOTE — Progress Notes (Signed)
Hematology/Oncology Progress note [phone] [phone]     Patient Care Team: Rayetta Humphrey, MD as PCP - General (Family Medicine)   Name of the patient: [name]  [id number]  01-30-1946  Date of visit: 08/01/23   INTERVAL HISTORY-   08/01/2023 status post  biliary drain placement, and ultrasound-guided liver mass biopsy.  Husband is at bedside.     Allergies  Allergen Reactions   Ezetimibe Other (See Comments)    heartburn   Simvastatin Other (See Comments)    Acid reflux symptoms   Niacin Other (See Comments)    Heartburn   Pravastatin Other (See Comments)    Acid reflux    Patient Active Problem List   Diagnosis Date Noted   Right upper quadrant abdominal pain 07/28/2023   Hyponatremia 07/28/2023   Hypokalemia 07/27/2023   Elevated LFTs 07/27/2023   Essential hypertension 07/27/2023   Hyperlipidemia 07/27/2023   Diabetes mellitus type 2, noninsulin dependent (HCC) 07/27/2023   AKI (acute kidney injury) (HCC) 07/27/2023   Liver mass 07/27/2023   Leukocytosis 07/27/2023     Past Medical History:  Diagnosis Date   Carcinoma of vaginal vault (HCC)    s/p Hysterectomy with vaginal resection   Diabetes mellitus type 2, controlled (HCC)    Essential hypertension    H/O: CVA (cerebrovascular accident) 04/21/2012   inferior posterior limb of the internal capsule, also tiny punctate acute infarct in the posterior aspect of the right parietal lobe   Hyperlipidemia    Status post cataract surgery      Past Surgical History:  Procedure Laterality Date   ABDOMINAL HYSTERECTOMY     vaginal resection   BREAST BIOPSY Left    bx/clip-neg   IR BILIARY DRAIN PLACEMENT WITH CHOLANGIOGRAM  07/31/2023   IR US LIVER BIOPSY  07/31/2023    Social History   Socioeconomic History   Marital status: Married    Spouse name: Not on file   Number of children: Not on file   Years of education: Not on file   Highest education level:  Not on file  Occupational History   Not on file  Tobacco Use   Smoking status: Every Day    Current packs/day: 0.25    Types: Cigarettes   Smokeless tobacco: Never  Vaping Use   Vaping status: Never Used  Substance and Sexual Activity   Alcohol use: No   Drug use: No   Sexual activity: Not Currently  Other Topics Concern   Not on file  Social History Narrative   She lives at home with her husband. She  smokes about 1/2 pack per day, used to smoke more than 2 packs in the past. She started smoking when she was 77 years old. No history of any alcohol use or drug abuse. She currently works at [employer] in [employer]. She used to work in [employer] in the past.     Social Determinants of Health   Financial Resource Strain: Medium Risk (03/17/2019)   Received from [facility]   Overall Financial Resource Strain (CARDIA)    Difficulty of Paying Living Expenses: Somewhat hard  Food Insecurity: No Food Insecurity (07/28/2023)   Hunger Vital Sign    Worried About Running Out of Food in the Last Year: Never true    Ran Out of Food in the Last Year: Never true  Transportation Needs: No Transportation Needs (07/28/2023)   PRAPARE - Administrator, Civil Service (Medical): No  Lack of Transportation (Non-Medical): No  Physical Activity: Insufficiently Active (03/17/2019)   Received from [facility]   Exercise Vital Sign    Days of Exercise per Week: 3 days    Minutes of Exercise per Session: 30 min  Stress: Not on file  Social Connections: Unknown (03/17/2019)   Received from [facility]   Social Connection and Isolation Panel [NHANES]    Frequency of Communication with Friends and Family: More than three times a week    Frequency of Social Gatherings with Friends and Family: More than three times a week    Attends Religious Services: Not on file    Active Member of Clubs or Organizations: Not on file    Attends Tax inspector Meetings: Not on file    Marital Status: Not on file  Intimate Partner Violence: Not At Risk (07/28/2023)   Humiliation, Afraid, Rape, and Kick questionnaire    Fear of Current or Ex-Partner: No    Emotionally Abused: No    Physically Abused: No    Sexually Abused: No     Family History  Problem Relation Age of Onset   Peripheral vascular disease Father    Heart disease Mother    Breast cancer Neg Hx      Current Facility-Administered Medications:    alum & mag hydroxide-simeth (MAALOX/MYLANTA) 200-200-20 MG/5ML suspension 30 mL, 30 mL, Oral, Q4H PRN, Enedina Finner, MD, 30 mL at 07/30/23 1644   heparin injection 5,000 Units, 5,000 Units, Subcutaneous, Q8H, Ralene Muskrat, PA-C, 5,000 Units at 08/01/23 0456   hydrALAZINE (APRESOLINE) injection 10 mg, 10 mg, Intravenous, Q4H PRN, Cox, Amy N, DO, 10 mg at 07/30/23 2019   insulin aspart (novoLOG) injection 0-5 Units, 0-5 Units, Subcutaneous, QHS, Cox, Amy N, DO, 2 Units at 07/31/23 2135   insulin aspart (novoLOG) injection 0-9 Units, 0-9 Units, Subcutaneous, TID WC, Cox, Amy N, DO, 5 Units at 08/01/23 0905   lisinopril (ZESTRIL) tablet 20 mg, 20 mg, Oral, Q supper, Enedina Finner, MD, 20 mg at 07/31/23 1752   metFORMIN (GLUCOPHAGE-XR) 24 hr tablet 750 mg, 750 mg, Oral, Q supper, Enedina Finner, MD, 750 mg at 07/31/23 1752   morphine (PF) 2 MG/ML injection 2 mg, 2 mg, Intravenous, Q4H PRN, Cox, Amy N, DO, 2 mg at 07/31/23 1434   ondansetron (ZOFRAN) tablet 4 mg, 4 mg, Oral, Q6H PRN **OR** ondansetron (ZOFRAN) injection 4 mg, 4 mg, Intravenous, Q6H PRN, Cox, Amy N, DO, 4 mg at 07/30/23 1445   senna-docusate (Senokot-S) tablet 1 tablet, 1 tablet, Oral, QHS PRN, Cox, Amy N, DO   sodium chloride flush (NS) 0.9 % injection 5 mL, 5 mL, Intracatheter, Q8H, Suttle, Dylan J, MD, 5 mL at 08/01/23 0615   traMADol (ULTRAM) tablet 50 mg, 50 mg, Oral, Q6H PRN, Enedina Finner, MD, 50 mg at 07/31/23 1751   Physical exam:  Vitals:   07/31/23 1732  07/31/23 2039 08/01/23 0351 08/01/23 0831  BP: (!) 108/59 (!) 101/59 131/67 125/64  Pulse: 76 76 74 72  Resp: 17 20 20    Temp: 98.4 F (36.9 C) (!) 97.5 F (36.4 C) 98.2 F (36.8 C) 97.6 F (36.4 C)  TempSrc:  Oral Oral   SpO2: 95% 95% 95% 94%  Weight:      Height:       Physical Exam    Labs    Latest Ref Rng & Units 07/29/2023    4:53 AM 07/28/2023    1:58 AM 07/27/2023  7:20 PM  CBC  WBC 4.0 - 10.5 K/uL 11.6  13.6    13.7  14.4   Hemoglobin 12.0 - 15.0 g/dL 16.1  09.6    04.5  40.9   Hematocrit 36.0 - 46.0 % 31.5  33.7    34.8  34.7   Platelets 150 - 400 K/uL 268  283    289  308       Latest Ref Rng & Units 07/31/2023    5:32 AM 07/30/2023    2:58 AM 07/29/2023    4:53 AM  CMP  Glucose 70 - 99 mg/dL 811  914  98   BUN 8 - 23 mg/dL 33  23  17   Creatinine 0.44 - 1.00 mg/dL 7.82  9.56  2.13   Sodium 135 - 145 mmol/L 132  135  131   Potassium 3.5 - 5.1 mmol/L 3.9  3.2  3.8   Chloride 98 - 111 mmol/L 96  92  100   CO2 22 - 32 mmol/L 24  28  22    Calcium 8.9 - 10.3 mg/dL 8.1  8.4  8.3   Total Protein 6.5 - 8.1 g/dL 5.2   5.0   Total Bilirubin <1.2 mg/dL 8.8   5.5   Alkaline Phos 38 - 126 U/L 450   383   AST 15 - 41 U/L 200   257   ALT 0 - 44 U/L 215   247      RADIOGRAPHIC STUDIES: I have personally reviewed the radiological images as listed and agreed with the findings in the report. IR US LIVER BIOPSY  Result Date: 07/31/2023 INDICATION: 77 year old female with history of indeterminate liver mass and biliary obstruction. EXAM: LIVER CORE BIOPSY MEDICATIONS: None. ANESTHESIA/SEDATION: Moderate (conscious) sedation was employed during this procedure. A total of Versed 2 mg and Fentanyl 50 mcg was administered intravenously. Moderate Sedation Time: 37 minutes. The patient's level of consciousness and vital signs were monitored continuously by radiology nursing throughout the procedure under my direct supervision. COMPLICATIONS: None immediate. PROCEDURE:  Informed written consent was obtained from the patient after a thorough discussion of the procedural risks, benefits and alternatives. All questions were addressed. Maximal Sterile Barrier Technique was utilized including caps, mask, sterile gowns, sterile gloves, sterile drape, hand hygiene and skin antiseptic. A timeout was performed prior to the initiation of the procedure. Preprocedure ultrasound demonstrated an ill-defined, mildly hyperechoic approximately 3 cm mass in the anterior right lobe of the liver compatible with location of mass on recent comparison imaging. The right upper quadrant was prepped and draped in standard fashion. Local anesthesia was administered subdermally at the planned entry site as well as under ultrasound guidance along the hepatic capsule. A skin nick was made. A 17 gauge introducer needle was advanced to the hepatic parenchyma under ultrasound guidance to the periphery of the mass. Next, a total of 1, 18 gauge core biopsy was obtained. The samples were placed in formalin and sent to Pathology. The introducer needle was withdrawn. Postprocedure ultrasound demonstrated no evidence of perihepatic fluid collection. The patient tolerated the procedure well. IMPRESSION: Technically successful ultrasound-guided right lobe liver mass core biopsy. Marliss Coots, MD [facility] Radiology Electronically Signed   By: Marliss Coots M.D.   On: 07/31/2023 13:30   IR BILIARY DRAIN PLACEMENT WITH CHOLANGIOGRAM  Result Date: 07/31/2023 INDICATION: 77 year old female with history of obstructive liver mass. EXAM: 1. Percutaneous cholangiogram. 2. Percutaneous biliary drain placement. MEDICATIONS: Cefoxitin, 2 g, intravenous;  The antibiotic was administered within an appropriate time frame prior to the initiation of the procedure. ANESTHESIA/SEDATION: Moderate (conscious) sedation was employed during this procedure. A total of Versed 2 mg and  Fentanyl 150 mcg was administered intravenously by the radiology nurse. Total intra-service moderate Sedation Time: 37 minutes. The patient's level of consciousness and vital signs were monitored continuously by radiology nursing throughout the procedure under my direct supervision. FLUOROSCOPY: Radiation Exposure Index (as provided by the fluoroscopic device): 25 mGy Kerma COMPLICATIONS: None immediate. PROCEDURE: Informed written consent was obtained from the patient after a thorough discussion of the procedural risks, benefits and alternatives. All questions were addressed. Maximal Sterile Barrier Technique was utilized including caps, mask, sterile gowns, sterile gloves, sterile drape, hand hygiene and skin antiseptic. A timeout was performed prior to the initiation of the procedure. Preprocedure ultrasound was evaluation of the right lobe of the liver demonstrated moderate to severe diffuse biliary ductal dilation. The procedure was planned. Subdermal Local anesthesia was provided at the planned needle entry site with 1% lidocaine. A small skin nick was made. Under direct ultrasound visualization, a 21 gauge Chiba needle was directed into a right lateral biliary radicle duct. A 018 wire was inserted with ease to the hilum. The inner portion of an Accustick set was then introduced over the wire and the wire was removed. Percutaneous cholangiogram was performed which demonstrated obstruction at the level of the biliary hilum. The inner dilator was then exchanged for the full Accustick sheath through which a Glidewire was inserted unable to be passed beyond the obstruction. Therefore, a 5 [place], angled tip catheter was inserted over the wire and used to guide the wire with moderate difficulty into the duodenum. The catheter was advanced. The wire was removed. Contrast injection demonstrated intraluminal position. An Amplatz wire was then placed and after serial dilation, a 10 [place] skater biliary drain was then  placed. The pigtail portion was formed in duodenum. Contrast injection demonstrated patency and good position with possible mild reflux into the left-sided biliary tree. The catheter was affixed to the skin with an interrupted 0 silk suture. A sterile bandage was applied. The drain was placed to bag drainage. The patient tolerated the procedure well and was transferred back to the floor in good condition. IMPRESSION: 1. Hilar obstruction due to known mass. 2. Technically successful placement of a right-sided 10 [place] internal external biliary drain. 3. The left-sided bile ducts were not definitively visualized on this study. If the patient's hyperbilirubinemia does not resolve appropriately, left-sided biliary drain placement could be considered. Marliss Coots, MD [facility] Radiology Electronically Signed   By: Marliss Coots M.D.   On: 07/31/2023 13:30   MR ABDOMEN MRCP W WO CONTAST  Result Date: 07/29/2023 CLINICAL DATA:  Infiltrative hepatic mass. MRI recommended for further characterization. EXAM: MRI ABDOMEN WITHOUT AND WITH CONTRAST (INCLUDING MRCP) TECHNIQUE: Multiplanar multisequence MR imaging of the abdomen was performed both before and after the administration of intravenous contrast. Heavily T2-weighted images of the biliary and pancreatic ducts were obtained, and three-dimensional MRCP images were rendered by post processing. CONTRAST:  6mL GADAVIST GADOBUTROL 1 MMOL/ML IV SOLN COMPARISON:  CT 07/26/2024 FINDINGS: Exam is degraded by patient respiratory motion. Lower chest:  Lung bases are clear. Hepatobiliary: Lobular mass in the central liver (segment 4A) is well-defined on diffusion-weighted imaging measuring 4.2 x 4.1 cm (image 76/series 9. No additional hepatic lesions identified. The mass is hypoenhancing (series 19). There is associated intrahepatic biliary duct  dilatation involving the LEFT and RIGHT hepatic ductal systems. Ductal dilatation  extends to the porta hepatis with abrupt narrowing of the ducks (image 46/13. The common hepatic duct is difficult to follow. The common bile duct is normal caliber at 5 mm. Gallbladder self appears normal. Pancreas: No pancreatic lesion no duct dilatation. Exam exam is degraded by respiratory motion. Spleen: Normal spleen. Adrenals/urinary tract: Adrenal glands difficult to evaluate due to motion degradation. Favor hyperplasia or adrenal adenomas. Stomach/Bowel: Stomach and limited of the small bowel is unremarkable Vascular/Lymphatic: Abdominal aortic normal caliber. No retroperitoneal periportal lymphadenopathy. Musculoskeletal: No aggressive osseous lesion IMPRESSION: 1. Exam is degraded by patient respiratory motion. Little information added to the CT interpretation. 2. Hypoenhancing mass centered in the central LEFT hepatic lobe (segment 4A) obstructs the LEFT and RIGHT intrahepatic ductal systems at the confluence (Klatskin type obstruction). 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 3. No additional sites metastatic disease the liver. 4. Adrenal glands difficult evaluation due to respiratory motion. Favor adrenal adenomas or hyperplasia. Electronically Signed   By: Genevive Bi M.D.   On: 07/29/2023 15:52   MR 3D Recon At Scanner  Result Date: 07/29/2023 CLINICAL DATA:  Infiltrative hepatic mass. MRI recommended for further characterization. EXAM: MRI ABDOMEN WITHOUT AND WITH CONTRAST (INCLUDING MRCP) TECHNIQUE: Multiplanar multisequence MR imaging of the abdomen was performed both before and after the administration of intravenous contrast. Heavily T2-weighted images of the biliary and pancreatic ducts were obtained, and three-dimensional MRCP images were rendered by post processing. CONTRAST:  6mL GADAVIST GADOBUTROL 1 MMOL/ML IV SOLN COMPARISON:  CT 07/26/2024 FINDINGS: Exam is degraded by patient respiratory motion. Lower chest:  Lung bases are clear. Hepatobiliary: Lobular mass in  the central liver (segment 4A) is well-defined on diffusion-weighted imaging measuring 4.2 x 4.1 cm (image 76/series 9. No additional hepatic lesions identified. The mass is hypoenhancing (series 19). There is associated intrahepatic biliary duct dilatation involving the LEFT and RIGHT hepatic ductal systems. Ductal dilatation extends to the porta hepatis with abrupt narrowing of the ducks (image 46/13. The common hepatic duct is difficult to follow. The common bile duct is normal caliber at 5 mm. Gallbladder self appears normal. Pancreas: No pancreatic lesion no duct dilatation. Exam exam is degraded by respiratory motion. Spleen: Normal spleen. Adrenals/urinary tract: Adrenal glands difficult to evaluate due to motion degradation. Favor hyperplasia or adrenal adenomas. Stomach/Bowel: Stomach and limited of the small bowel is unremarkable Vascular/Lymphatic: Abdominal aortic normal caliber. No retroperitoneal periportal lymphadenopathy. Musculoskeletal: No aggressive osseous lesion IMPRESSION: 1. Exam is degraded by patient respiratory motion. Little information added to the CT interpretation. 2. Hypoenhancing mass centered in the central LEFT hepatic lobe (segment 4A) obstructs the LEFT and RIGHT intrahepatic ductal systems at the confluence (Klatskin type obstruction). 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 3. No additional sites metastatic disease the liver. 4. Adrenal glands difficult evaluation due to respiratory motion. Favor adrenal adenomas or hyperplasia. Electronically Signed   By: Genevive Bi M.D.   On: 07/29/2023 15:52   CT CHEST W CONTRAST  Result Date: 07/29/2023 CLINICAL DATA:  77 year old female with history of unintended weight loss. EXAM: CT CHEST WITH CONTRAST TECHNIQUE: Multidetector CT imaging of the chest was performed during intravenous contrast administration. RADIATION DOSE REDUCTION: This exam was performed according to the departmental dose-optimization program  which includes automated exposure control, adjustment of the mA and/or kV according to patient size and/or use of iterative reconstruction technique. CONTRAST:  75mL OMNIPAQUE IOHEXOL 300 MG/ML  SOLN COMPARISON:  No priors. FINDINGS: Cardiovascular:  Heart size is normal. There is no significant pericardial fluid, thickening or pericardial calcification. There is aortic atherosclerosis, as well as atherosclerosis of the great vessels of the mediastinum and the coronary arteries, including calcified atherosclerotic plaque in the left anterior descending, left circumflex and right coronary arteries. Calcifications of the aortic valve. Calcifications of the mitral annulus. Mediastinum/Nodes: No pathologically enlarged mediastinal or hilar lymph nodes. Small hiatal hernia. No axillary lymphadenopathy. Lungs/Pleura: Bilateral apical nodular pleuroparenchymal thickening and architectural distortion is noted, most compatible with areas of chronic post infectious or inflammatory scarring. A few scattered tiny 1-3 mm pulmonary nodules are noted throughout the lungs bilaterally, nonspecific. No other larger more suspicious appearing pulmonary nodules or masses are noted. No acute consolidative airspace disease. No pleural effusions. Upper Abdomen: Poorly defined mass-like area in the central liver estimated to measure 4.8 x 3.5 cm, but incompletely imaged (axial image 154 of series 2). Severe intrahepatic biliary ductal dilatation also noted. Atherosclerotic calcifications in the abdominal aorta. Both adrenal glands are enlarged, with dominant nodule in the left adrenal gland measuring up to 2.4 x 1.9 cm. Musculoskeletal: There are no aggressive appearing lytic or blastic lesions noted in the visualized portions of the skeleton. IMPRESSION: 1. Multiple tiny 1-3 mm pulmonary nodules, nonspecific, but statistically likely benign. No follow-up needed if patient is low-risk (and has no known or suspected primary neoplasm).  Non-contrast chest CT can be considered in 12 months if patient is high-risk. This recommendation follows the consensus statement: Guidelines for Management of Incidental Pulmonary Nodules Detected on CT Images: From the Fleischner Society 2017; Radiology 2017; 284:228-243. 2. Abdominal findings strongly suggestive of underlying malignancy, partially imaged, as above. As previously suggested, further evaluation with MRI of the abdomen with and without IV gadolinium is recommended to better evaluate these findings. 3. Aortic atherosclerosis, in addition to three-vessel coronary artery disease. Assessment for potential risk factor modification, dietary therapy or pharmacologic therapy may be warranted, if clinically indicated. 4. There are calcifications of the aortic valve and mitral annulus. Echocardiographic correlation for evaluation of potential valvular dysfunction may be warranted if clinically indicated. Aortic Atherosclerosis (ICD10-I70.0). Electronically Signed   By: Trudie Reed M.D.   On: 07/29/2023 06:56   CT ABDOMEN PELVIS W CONTRAST  Result Date: 07/27/2023 CLINICAL DATA:  Right lower quadrant abdominal pain. No bowel movement in 3 days EXAM: CT ABDOMEN AND PELVIS WITH CONTRAST TECHNIQUE: Multidetector CT imaging of the abdomen and pelvis was performed using the standard protocol following bolus administration of intravenous contrast. RADIATION DOSE REDUCTION: This exam was performed according to the departmental dose-optimization program which includes automated exposure control, adjustment of the mA and/or kV according to patient size and/or use of iterative reconstruction technique. CONTRAST:  80mL OMNIPAQUE IOHEXOL 300 MG/ML  SOLN COMPARISON:  Same day abdominal radiograph FINDINGS: Lower chest: No acute abnormality. Hepatobiliary: Ill-defined focus of hypoattenuation in the left hepatic lobe superior to the gallbladder. This measures 4.1 x 2.4 cm. Normal gallbladder. The intrahepatic bile  ducts are moderately dilated. No dilation of the common bile duct. No radiopaque stone. Pancreas: Unremarkable. No pancreatic ductal dilatation or surrounding inflammatory changes. Spleen: Unremarkable. Adrenals/Urinary Tract: Bilateral adrenal gland hyperplasia. There is nodular thickening in the left adrenal gland measuring 1.5 cm mild enhancement (Hounsfield units 73). Low-attenuation lesions in the kidneys are statistically likely to represent cysts. No follow-up is required. No urinary calculi or hydronephrosis. Stomach/Bowel: Large stool ball in the rectum. Moderate colonic stool load. Extensive colonic diverticulosis without evidence of diverticulitis. Stomach is  within normal limits. The appendix is normal. Vascular/Lymphatic: Aortic atherosclerosis. Portal vein is patent. No enlarged abdominal or pelvic lymph nodes. Reproductive: Status post hysterectomy. No adnexal masses. Other: No free intraperitoneal fluid or air. Musculoskeletal: No acute fracture or destructive osseous lesion. IMPRESSION: 1. Constipation with large stool ball in the rectum. 2. Ill-defined focus of hypoattenuation in the left hepatic lobe superior to the gallbladder measuring 4.1 cm. Given associated intrahepatic biliary dilation this is concerning for metastasis or primary HCC causing biliary obstruction. Nonemergent hepatic protocol MRI of the abdomen with and without contrast is recommended. 3. Hyperplasia of the bilateral adrenal glands with 1.5 cm nodular thickening in the left adrenal gland. This can also be further evaluated with MRI. 4. Extensive colonic diverticulosis without diverticulitis. Aortic Atherosclerosis (ICD10-I70.0). Electronically Signed   By: Minerva Fester M.D.   On: 07/27/2023 21:39   DG Abd 1 View  Result Date: 07/27/2023 CLINICAL DATA:  Abdomen pain EXAM: ABDOMEN - 1 VIEW COMPARISON:  None Available. FINDINGS: Nonobstructed gas pattern. Large stool in the colon and rectum. Numerous pelvic surgical  clips. Pelvic phleboliths. IMPRESSION: Nonobstructed gas pattern with large stool in the colon and rectum suggesting constipation. Electronically Signed   By: Jasmine Pang M.D.   On: 07/27/2023 17:02    Assessment and plan-   # liver mass,  MRI and MRCP results reviewed with patient and husband. elevated CEA, CA 19-9,  LDH, possibly pancreatic or biliary origin.  Normal AFP.  Status post biliary drain placement and ultrasound-guided liver biopsy.  Pathology is pending. She will need to follow-up outpatient with me to discuss results and management plan.     # obstructive jaundice Normal  PT, PTT, negative. hepatitis panel. Status post biliary drain placement.  Recheck LFT   # Leukocytosis, trending down. blood culture negative x 2.     # Weight loss, malnutrition.  Consult dietitian  Thank you for allowing me to participate in [email or web address] care of this patient.   Rickard Patience, MD, PhD Hematology Oncology 08/01/2023

## 2023-08-01 NOTE — Discharge Summary (Signed)
Physician Discharge Summary   Patient: [name] MRN: [id number] DOB: March 29, 1946  Admit date:     07/27/2023  Discharge date: 08/01/23  Discharge Physician: Enedina Finner   PCP: Rayetta Humphrey, MD   Recommendations at discharge:    F/u IR regarding PTC biliary drain F/u Dr Cathie Hoops at the cancer center on the appt that will be called F/u PCP in 1-2 weeks  Discharge Diagnoses: Principal Problem:   Elevated LFTs Active Problems:   Hypokalemia   Essential hypertension   Hyperlipidemia   Diabetes mellitus type 2, noninsulin dependent (HCC)   AKI (acute kidney injury) (HCC)   Liver mass   Leukocytosis   Right upper quadrant abdominal pain   Hyponatremia   Obstructive jaundice due to cancer (HCC)   Moderate protein-calorie malnutrition (HCC)   77 year old female with history of hypertension, non-insulin-dependent diabetes mellitus, hyperlipidemia, history of tobacco use, who presents to the emergency department for chief concerns of abdominal pain    Elevated LFT  /Liver mass obstructive jaundice -- MRI notable for infiltrative cholangiocarcinoma Blood cultures x 2 neg thus far -- rising bilirubin 3.3--- 5.1 -- discussed with G.I. and oncology Dr. Cathie Hoops--- recommends percutaneous biliary drain with biopsy of liver mass --CA 19-9 1935 -- d/w Dr Elby Showers  and will get PTC biliary drain and liver mass bx 11/29 --11/30--s/p PTC biliary drain and liver mass bx --PT/OT to see pt--recommends  HH   Leukocytosis --Differentials include sepsis although doubt it -- sepsis ruled out --Blood cultures x 2 are neg thus far --patient was on broad-spectrum cefepime with metronidazole 500 mg IV twice daily-- discontinue since etiology is highly likely cancer   AKI (acute kidney injury) (HCC) --Resolved with hydration   Diabetes mellitus type 2, noninsulin dependent (HCC) --insulin SSI with at bedtime coverage ordered --Home metformin and semaglutide not resumed on  admission --will resume DM meds at d/c   Hyperlipidemia Home atorvastatin  not resumed on admission due to elevated LFTs--will defer to PCP to resume at later dates once LFTs trend down   Essential hypertension -- resume lisinopril   Hypokalemia potassium 3.8   Acute delirium/agitation -- improved. Patient much calm   [facility] Palliative care c/s, overall poor prognosis   D/c home. Pt and husband agreeable        Procedures:PTC drain + with liver biopsy Family communication :husband at bedside consults : G.I., oncology CODE STATUS: full DVT Prophylaxis : heparin     Pain control - [place] Controlled Substance Reporting System database was reviewed. and patient was instructed, not to drive, operate heavy machinery, perform activities at heights, swimming or participation in water activities or provide baby-sitting services while on Pain, Sleep and Anxiety Medications; until their outpatient Physician has advised to do so again. Also recommended to not to take more than prescribed Pain, Sleep and Anxiety Medications.  Disposition: Home health Diet recommendation:  Discharge Diet Orders (From admission, onward)     Start     Ordered   08/01/23 0000  Diet - low sodium heart healthy        08/01/23 1453           Cardiac and Carb modified diet DISCHARGE MEDICATION: Allergies as of 08/01/2023       Reactions   Ezetimibe Other (See Comments)   heartburn   Simvastatin Other (See Comments)   Acid reflux symptoms   Niacin Other (See Comments)   Heartburn   Pravastatin Other (See Comments)   Acid reflux  Medication List     STOP taking these medications    Baclofen 5 MG Tabs       TAKE these medications    aspirin 81 MG chewable tablet Chew 81 mg by mouth daily with supper.   atorvastatin 40 MG tablet Commonly known as: LIPITOR Take 40 mg by mouth daily after supper.   lisinopril 20 MG tablet Commonly known as: ZESTRIL Take 1 tablet by  mouth daily with supper.   metFORMIN 750 MG 24 hr tablet Commonly known as: GLUCOPHAGE-XR Take 750 mg by mouth daily with supper.   Semaglutide(0.25 or 0.5MG /DOS) 2 MG/3ML Sopn Inject 0.5 mg into the skin once a week. On Wednesday   traMADol 50 MG tablet Commonly known as: ULTRAM Take 1 tablet (50 mg total) by mouth every 12 (twelve) hours as needed for moderate pain (pain score 4-6).               Durable Medical Equipment  (From admission, onward)           Start     Ordered   08/01/23 1450  For home use only DME Walker rolling  Once       Question Answer Comment  Walker: With 5 Inch Wheels   Patient needs a walker to treat with the following condition General weakness      08/01/23 1449            Follow-up Information     Suttle, Thressa Sheller, MD Follow up.   Specialties: Interventional Radiology, Diagnostic Radiology, Radiology Why: Schedulers will call to arrange follow-up as needed. Contact information: [address] [place] [address] [phone]         Rickard Patience, MD. Schedule an appointment as soon as possible for a visit in 1 week(s).   Specialty: Oncology Why: f/u liver biopsy results Contact information: [address] [place] [address] [phone]         Rayetta Humphrey, MD. Schedule an appointment as soon as possible for a visit in 1 week(s).   Specialty: Family Medicine Contact information: [address] [place] [address] [phone]                   Condition at discharge: fair  The results of significant diagnostics from this hospitalization (including imaging, microbiology, ancillary and laboratory) are listed below for reference.   Imaging Studies: IR US LIVER BIOPSY  Result Date: 07/31/2023 INDICATION: 77 year old female with history of indeterminate liver mass and biliary obstruction. EXAM: LIVER CORE BIOPSY MEDICATIONS: None. ANESTHESIA/SEDATION: Moderate (conscious)  sedation was employed during this procedure. A total of Versed 2 mg and Fentanyl 50 mcg was administered intravenously. Moderate Sedation Time: 37 minutes. The patient's level of consciousness and vital signs were monitored continuously by radiology nursing throughout the procedure under my direct supervision. COMPLICATIONS: None immediate. PROCEDURE: Informed written consent was obtained from the patient after a thorough discussion of the procedural risks, benefits and alternatives. All questions were addressed. Maximal Sterile Barrier Technique was utilized including caps, mask, sterile gowns, sterile gloves, sterile drape, hand hygiene and skin antiseptic. A timeout was performed prior to the initiation of the procedure. Preprocedure ultrasound demonstrated an ill-defined, mildly hyperechoic approximately 3 cm mass in the anterior right lobe of the liver compatible with location of mass on recent comparison imaging. The right upper quadrant was prepped and draped in standard fashion. Local anesthesia was administered subdermally at the planned entry site as well  as under ultrasound guidance along the hepatic capsule. A skin nick was made. A 17 gauge introducer needle was advanced to the hepatic parenchyma under ultrasound guidance to the periphery of the mass. Next, a total of 1, 18 gauge core biopsy was obtained. The samples were placed in formalin and sent to Pathology. The introducer needle was withdrawn. Postprocedure ultrasound demonstrated no evidence of perihepatic fluid collection. The patient tolerated the procedure well. IMPRESSION: Technically successful ultrasound-guided right lobe liver mass core biopsy. Marliss Coots, MD [facility] Radiology Electronically Signed   By: Marliss Coots M.D.   On: 07/31/2023 13:30   IR BILIARY DRAIN PLACEMENT WITH CHOLANGIOGRAM  Result Date: 07/31/2023 INDICATION: 77 year old female with history of obstructive liver  mass. EXAM: 1. Percutaneous cholangiogram. 2. Percutaneous biliary drain placement. MEDICATIONS: Cefoxitin, 2 g, intravenous; The antibiotic was administered within an appropriate time frame prior to the initiation of the procedure. ANESTHESIA/SEDATION: Moderate (conscious) sedation was employed during this procedure. A total of Versed 2 mg and Fentanyl 150 mcg was administered intravenously by the radiology nurse. Total intra-service moderate Sedation Time: 37 minutes. The patient's level of consciousness and vital signs were monitored continuously by radiology nursing throughout the procedure under my direct supervision. FLUOROSCOPY: Radiation Exposure Index (as provided by the fluoroscopic device): 25 mGy Kerma COMPLICATIONS: None immediate. PROCEDURE: Informed written consent was obtained from the patient after a thorough discussion of the procedural risks, benefits and alternatives. All questions were addressed. Maximal Sterile Barrier Technique was utilized including caps, mask, sterile gowns, sterile gloves, sterile drape, hand hygiene and skin antiseptic. A timeout was performed prior to the initiation of the procedure. Preprocedure ultrasound was evaluation of the right lobe of the liver demonstrated moderate to severe diffuse biliary ductal dilation. The procedure was planned. Subdermal Local anesthesia was provided at the planned needle entry site with 1% lidocaine. A small skin nick was made. Under direct ultrasound visualization, a 21 gauge Chiba needle was directed into a right lateral biliary radicle duct. A 018 wire was inserted with ease to the hilum. The inner portion of an Accustick set was then introduced over the wire and the wire was removed. Percutaneous cholangiogram was performed which demonstrated obstruction at the level of the biliary hilum. The inner dilator was then exchanged for the full Accustick sheath through which a Glidewire was inserted unable to be passed beyond the obstruction.  Therefore, a 5 [place], angled tip catheter was inserted over the wire and used to guide the wire with moderate difficulty into the duodenum. The catheter was advanced. The wire was removed. Contrast injection demonstrated intraluminal position. An Amplatz wire was then placed and after serial dilation, a 10 [place] skater biliary drain was then placed. The pigtail portion was formed in duodenum. Contrast injection demonstrated patency and good position with possible mild reflux into the left-sided biliary tree. The catheter was affixed to the skin with an interrupted 0 silk suture. A sterile bandage was applied. The drain was placed to bag drainage. The patient tolerated the procedure well and was transferred back to the floor in good condition. IMPRESSION: 1. Hilar obstruction due to known mass. 2. Technically successful placement of a right-sided 10 [place] internal external biliary drain. 3. The left-sided bile ducts were not definitively visualized on this study. If the patient's hyperbilirubinemia does not resolve appropriately, left-sided biliary drain placement could be considered. Marliss Coots, MD [facility] Radiology Electronically Signed   By: Jae Dire.D.  On: 07/31/2023 13:30   MR ABDOMEN MRCP W WO CONTAST  Result Date: 07/29/2023 CLINICAL DATA:  Infiltrative hepatic mass. MRI recommended for further characterization. EXAM: MRI ABDOMEN WITHOUT AND WITH CONTRAST (INCLUDING MRCP) TECHNIQUE: Multiplanar multisequence MR imaging of the abdomen was performed both before and after the administration of intravenous contrast. Heavily T2-weighted images of the biliary and pancreatic ducts were obtained, and three-dimensional MRCP images were rendered by post processing. CONTRAST:  6mL GADAVIST GADOBUTROL 1 MMOL/ML IV SOLN COMPARISON:  CT 07/26/2024 FINDINGS: Exam is degraded by patient respiratory motion. Lower chest:  Lung bases are clear.  Hepatobiliary: Lobular mass in the central liver (segment 4A) is well-defined on diffusion-weighted imaging measuring 4.2 x 4.1 cm (image 76/series 9. No additional hepatic lesions identified. The mass is hypoenhancing (series 19). There is associated intrahepatic biliary duct dilatation involving the LEFT and RIGHT hepatic ductal systems. Ductal dilatation extends to the porta hepatis with abrupt narrowing of the ducks (image 46/13. The common hepatic duct is difficult to follow. The common bile duct is normal caliber at 5 mm. Gallbladder self appears normal. Pancreas: No pancreatic lesion no duct dilatation. Exam exam is degraded by respiratory motion. Spleen: Normal spleen. Adrenals/urinary tract: Adrenal glands difficult to evaluate due to motion degradation. Favor hyperplasia or adrenal adenomas. Stomach/Bowel: Stomach and limited of the small bowel is unremarkable Vascular/Lymphatic: Abdominal aortic normal caliber. No retroperitoneal periportal lymphadenopathy. Musculoskeletal: No aggressive osseous lesion IMPRESSION: 1. Exam is degraded by patient respiratory motion. Little information added to the CT interpretation. 2. Hypoenhancing mass centered in the central LEFT hepatic lobe (segment 4A) obstructs the LEFT and RIGHT intrahepatic ductal systems at the confluence (Klatskin type obstruction). 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 3. No additional sites metastatic disease the liver. 4. Adrenal glands difficult evaluation due to respiratory motion. Favor adrenal adenomas or hyperplasia. Electronically Signed   By: Genevive Bi M.D.   On: 07/29/2023 15:52   MR 3D Recon At Scanner  Result Date: 07/29/2023 CLINICAL DATA:  Infiltrative hepatic mass. MRI recommended for further characterization. EXAM: MRI ABDOMEN WITHOUT AND WITH CONTRAST (INCLUDING MRCP) TECHNIQUE: Multiplanar multisequence MR imaging of the abdomen was performed both before and after the administration of intravenous  contrast. Heavily T2-weighted images of the biliary and pancreatic ducts were obtained, and three-dimensional MRCP images were rendered by post processing. CONTRAST:  6mL GADAVIST GADOBUTROL 1 MMOL/ML IV SOLN COMPARISON:  CT 07/26/2024 FINDINGS: Exam is degraded by patient respiratory motion. Lower chest:  Lung bases are clear. Hepatobiliary: Lobular mass in the central liver (segment 4A) is well-defined on diffusion-weighted imaging measuring 4.2 x 4.1 cm (image 76/series 9. No additional hepatic lesions identified. The mass is hypoenhancing (series 19). There is associated intrahepatic biliary duct dilatation involving the LEFT and RIGHT hepatic ductal systems. Ductal dilatation extends to the porta hepatis with abrupt narrowing of the ducks (image 46/13. The common hepatic duct is difficult to follow. The common bile duct is normal caliber at 5 mm. Gallbladder self appears normal. Pancreas: No pancreatic lesion no duct dilatation. Exam exam is degraded by respiratory motion. Spleen: Normal spleen. Adrenals/urinary tract: Adrenal glands difficult to evaluate due to motion degradation. Favor hyperplasia or adrenal adenomas. Stomach/Bowel: Stomach and limited of the small bowel is unremarkable Vascular/Lymphatic: Abdominal aortic normal caliber. No retroperitoneal periportal lymphadenopathy. Musculoskeletal: No aggressive osseous lesion IMPRESSION: 1. Exam is degraded by patient respiratory motion. Little information added to the CT interpretation. 2. Hypoenhancing mass centered in the central LEFT hepatic lobe (segment 4A) obstructs the LEFT  and RIGHT intrahepatic ductal systems at the confluence (Klatskin type obstruction). 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 3. No additional sites metastatic disease the liver. 4. Adrenal glands difficult evaluation due to respiratory motion. Favor adrenal adenomas or hyperplasia. Electronically Signed   By: Genevive Bi M.D.   On: 07/29/2023 15:52   CT  CHEST W CONTRAST  Result Date: 07/29/2023 CLINICAL DATA:  77 year old female with history of unintended weight loss. EXAM: CT CHEST WITH CONTRAST TECHNIQUE: Multidetector CT imaging of the chest was performed during intravenous contrast administration. RADIATION DOSE REDUCTION: This exam was performed according to the departmental dose-optimization program which includes automated exposure control, adjustment of the mA and/or kV according to patient size and/or use of iterative reconstruction technique. CONTRAST:  75mL OMNIPAQUE IOHEXOL 300 MG/ML  SOLN COMPARISON:  No priors. FINDINGS: Cardiovascular: Heart size is normal. There is no significant pericardial fluid, thickening or pericardial calcification. There is aortic atherosclerosis, as well as atherosclerosis of the great vessels of the mediastinum and the coronary arteries, including calcified atherosclerotic plaque in the left anterior descending, left circumflex and right coronary arteries. Calcifications of the aortic valve. Calcifications of the mitral annulus. Mediastinum/Nodes: No pathologically enlarged mediastinal or hilar lymph nodes. Small hiatal hernia. No axillary lymphadenopathy. Lungs/Pleura: Bilateral apical nodular pleuroparenchymal thickening and architectural distortion is noted, most compatible with areas of chronic post infectious or inflammatory scarring. A few scattered tiny 1-3 mm pulmonary nodules are noted throughout the lungs bilaterally, nonspecific. No other larger more suspicious appearing pulmonary nodules or masses are noted. No acute consolidative airspace disease. No pleural effusions. Upper Abdomen: Poorly defined mass-like area in the central liver estimated to measure 4.8 x 3.5 cm, but incompletely imaged (axial image 154 of series 2). Severe intrahepatic biliary ductal dilatation also noted. Atherosclerotic calcifications in the abdominal aorta. Both adrenal glands are enlarged, with dominant nodule in the left adrenal  gland measuring up to 2.4 x 1.9 cm. Musculoskeletal: There are no aggressive appearing lytic or blastic lesions noted in the visualized portions of the skeleton. IMPRESSION: 1. Multiple tiny 1-3 mm pulmonary nodules, nonspecific, but statistically likely benign. No follow-up needed if patient is low-risk (and has no known or suspected primary neoplasm). Non-contrast chest CT can be considered in 12 months if patient is high-risk. This recommendation follows the consensus statement: Guidelines for Management of Incidental Pulmonary Nodules Detected on CT Images: From the Fleischner Society 2017; Radiology 2017; 284:228-243. 2. Abdominal findings strongly suggestive of underlying malignancy, partially imaged, as above. As previously suggested, further evaluation with MRI of the abdomen with and without IV gadolinium is recommended to better evaluate these findings. 3. Aortic atherosclerosis, in addition to three-vessel coronary artery disease. Assessment for potential risk factor modification, dietary therapy or pharmacologic therapy may be warranted, if clinically indicated. 4. There are calcifications of the aortic valve and mitral annulus. Echocardiographic correlation for evaluation of potential valvular dysfunction may be warranted if clinically indicated. Aortic Atherosclerosis (ICD10-I70.0). Electronically Signed   By: Trudie Reed M.D.   On: 07/29/2023 06:56   CT ABDOMEN PELVIS W CONTRAST  Result Date: 07/27/2023 CLINICAL DATA:  Right lower quadrant abdominal pain. No bowel movement in 3 days EXAM: CT ABDOMEN AND PELVIS WITH CONTRAST TECHNIQUE: Multidetector CT imaging of the abdomen and pelvis was performed using the standard protocol following bolus administration of intravenous contrast. RADIATION DOSE REDUCTION: This exam was performed according to the departmental dose-optimization program which includes automated exposure control, adjustment of the mA and/or kV according to patient size and/or  use of iterative reconstruction technique. CONTRAST:  80mL OMNIPAQUE IOHEXOL 300 MG/ML  SOLN COMPARISON:  Same day abdominal radiograph FINDINGS: Lower chest: No acute abnormality. Hepatobiliary: Ill-defined focus of hypoattenuation in the left hepatic lobe superior to the gallbladder. This measures 4.1 x 2.4 cm. Normal gallbladder. The intrahepatic bile ducts are moderately dilated. No dilation of the common bile duct. No radiopaque stone. Pancreas: Unremarkable. No pancreatic ductal dilatation or surrounding inflammatory changes. Spleen: Unremarkable. Adrenals/Urinary Tract: Bilateral adrenal gland hyperplasia. There is nodular thickening in the left adrenal gland measuring 1.5 cm mild enhancement (Hounsfield units 73). Low-attenuation lesions in the kidneys are statistically likely to represent cysts. No follow-up is required. No urinary calculi or hydronephrosis. Stomach/Bowel: Large stool ball in the rectum. Moderate colonic stool load. Extensive colonic diverticulosis without evidence of diverticulitis. Stomach is within normal limits. The appendix is normal. Vascular/Lymphatic: Aortic atherosclerosis. Portal vein is patent. No enlarged abdominal or pelvic lymph nodes. Reproductive: Status post hysterectomy. No adnexal masses. Other: No free intraperitoneal fluid or air. Musculoskeletal: No acute fracture or destructive osseous lesion. IMPRESSION: 1. Constipation with large stool ball in the rectum. 2. Ill-defined focus of hypoattenuation in the left hepatic lobe superior to the gallbladder measuring 4.1 cm. Given associated intrahepatic biliary dilation this is concerning for metastasis or primary HCC causing biliary obstruction. Nonemergent hepatic protocol MRI of the abdomen with and without contrast is recommended. 3. Hyperplasia of the bilateral adrenal glands with 1.5 cm nodular thickening in the left adrenal gland. This can also be further evaluated with MRI. 4. Extensive colonic diverticulosis without  diverticulitis. Aortic Atherosclerosis (ICD10-I70.0). Electronically Signed   By: Minerva Fester M.D.   On: 07/27/2023 21:39   DG Abd 1 View  Result Date: 07/27/2023 CLINICAL DATA:  Abdomen pain EXAM: ABDOMEN - 1 VIEW COMPARISON:  None Available. FINDINGS: Nonobstructed gas pattern. Large stool in the colon and rectum. Numerous pelvic surgical clips. Pelvic phleboliths. IMPRESSION: Nonobstructed gas pattern with large stool in the colon and rectum suggesting constipation. Electronically Signed   By: Jasmine Pang M.D.   On: 07/27/2023 17:02    Microbiology: Results for orders placed or performed during the hospital encounter of 07/27/23  Blood culture (routine x 2)     Status: None   Collection Time: 07/27/23 10:03 PM   Specimen: BLOOD  Result Value Ref Range Status   Specimen Description BLOOD RIGHT ASSIST CONTROL  Final   Special Requests   Final    BOTTLES DRAWN AEROBIC AND ANAEROBIC Blood Culture results may not be optimal due to an inadequate volume of blood received in culture bottles   Culture   Final    NO GROWTH 5 DAYS Performed at [facility], [address] [place], [place] [address]    Report Status 08/01/2023 FINAL  Final  Blood culture (routine x 2)     Status: None   Collection Time: 07/27/23 10:03 PM   Specimen: BLOOD  Result Value Ref Range Status   Specimen Description BLOOD LEFT ASSIST CONTROL  Final   Special Requests   Final    BOTTLES DRAWN AEROBIC AND ANAEROBIC Blood Culture adequate volume   Culture   Final    NO GROWTH 5 DAYS Performed at [facility], [address] [place], [place] [address]    Report Status 08/01/2023 FINAL  Final    Labs: CBC: Recent Labs  Lab 07/27/23 1920 07/28/23 0158 07/29/23 0453  WBC 14.4* 13.7*  13.6* 11.6*  NEUTROABS  --  11.0*  --  HGB 11.9* 12.0  11.9* 11.1*  HCT 34.7* 34.8*  33.7* 31.5*  MCV 89.2 89.0  85.5 86.5  PLT 308 289  283 268   Basic Metabolic Panel: Recent Labs  Lab  07/27/23 1920 07/28/23 0158 07/29/23 0453 07/30/23 0258 07/31/23 0532  NA 129* 131* 131* 135 132*  K 3.2* 3.0* 3.8 3.2* 3.9  CL 87* 92* 100 92* 96*  CO2 28 28 22 28 24   GLUCOSE 265* 238* 98 196* 234*  BUN 38* 33* 17 23 33*  CREATININE 1.27* 0.98 0.74 0.78 0.85  CALCIUM 8.9 8.4* 8.3* 8.4* 8.1*  MG 2.5*  --  2.0 2.2  --   PHOS  --   --  2.0* 2.5  --    Liver Function Tests: Recent Labs  Lab 07/27/23 1920 07/28/23 0158 07/29/23 0453 07/31/23 0532 08/01/23 1136  AST 162* 208* 257* 200* 70*  ALT 269* 249* 247* 215* 169*  ALKPHOS 446* 410* 383* 450* 410*  BILITOT 2.1* 3.3* 5.5* 8.8* 4.3*  PROT 6.7 5.9* 5.0* 5.2* 5.7*  ALBUMIN 3.4* 3.2* 2.6* 2.6* 2.8*   CBG: Recent Labs  Lab 07/31/23 0750 07/31/23 1730 07/31/23 2126 08/01/23 0850 08/01/23 1220  GLUCAP 211* 220* 218* 278* 260*    Discharge time spent: greater than 30 minutes.  Signed: Enedina Finner, MD [facility] 08/01/2023

## 2023-08-01 NOTE — Consult Note (Signed)
PHARMACY CONSULT NOTE - ELECTROLYTES  Pharmacy Consult for Electrolyte Monitoring and Replacement   Recent Labs: Potassium (mmol/L)  Date Value  07/31/2023 3.9  04/22/2012 3.7   Magnesium (mg/dL)  Date Value  09/32/3557 2.2  04/22/2012 1.9   Calcium (mg/dL)  Date Value  32/20/2542 8.1 (L)   Calcium, Total (mg/dL)  Date Value  70/62/3762 8.7   Albumin (g/dL)  Date Value  83/15/1761 2.6 (L)  04/21/2012 3.8   Phosphorus (mg/dL)  Date Value  60/73/7106 2.5   Sodium (mmol/L)  Date Value  07/31/2023 132 (L)  04/22/2012 144   Height: 5\' 3"  (160 cm) Weight: 56.7 kg (125 lb) IBW/kg (Calculated) : 52.4 Estimated Creatinine Clearance: 45.9 mL/min (by C-G formula based on SCr of 0.85 mg/dL).  Assessment  [name] is a 77 y.o. female presenting with abdominal pain with subsequent findings concerning for liver mass and malignant process. PMH significant for hypertension, non-insulin-dependent diabetes mellitus, hyperlipidemia, history of tobacco use. Pharmacy has been consulted to monitor and replace electrolytes.  Diet: heart healthy/carb modified MIVF: N/A Pertinent medications: N/A  Goal of Therapy: Electrolytes within normal limits  Plan:  No electrolyte replacement indicated at this time Check electrolytes tomorrow. Consider [phone] given stability  Thank you for allowing pharmacy to be a part of this patient's care.  Tressie Ellis 08/01/2023 7:44 AM

## 2023-08-01 NOTE — Discharge Instructions (Signed)
Call IR at [phone] if you have issues with biliary drain

## 2023-08-01 NOTE — Evaluation (Signed)
Physical Therapy Evaluation Patient Details Name: [name] MRN: [id number] DOB: [birth year] Today's Date: 08/01/2023  History of Present Illness  77 year old female with history of hypertension, non-insulin-dependent diabetes mellitus, hyperlipidemia, history of tobacco use, who presents to the emergency department for chief concerns of abdominal pain. Elevated LFTs  Liver mass  obstructive jaundice  were found. Now S/P percutaneous biliary drain with biopsy of liver mass.  Clinical Impression  Pt received in bed with family by her side agreeable to PT evaluation. Pt is working full time at [employer] and Ind with all functional mobility without AD. PT assessment revealed pt has generalized weakness contributing to pt requiring CGA ot Sup with all functional mobility. Pt ind with bed mobility. Pt is safer with FWW fro ambulation and needs Nursing instructions to secure the drainage bag. Pt's spouse is supportive and available 24/7 for any help as needed. Pt will benefit form HHPT, FWW and BSC ( especially for night use). PT will continue in acute care.       If plan is discharge home, recommend the following: A little help with walking and/or transfers;A little help with bathing/dressing/bathroom;Assistance with cooking/housework;Assist for transportation   Can travel by private vehicle        Equipment Recommendations Rolling walker (2 wheels);BSC/3in1  Recommendations for Other Services       Functional Status Assessment Patient has had a recent decline in their functional status and demonstrates the ability to make significant improvements in function in a reasonable and predictable amount of time.     Precautions / Restrictions Precautions Precautions: None Restrictions Weight Bearing Restrictions: No      Mobility  Bed Mobility Overal bed mobility: Independent                  Transfers Overall transfer level: Needs assistance Equipment used: Rolling  walker (2 wheels) Transfers: Sit to/from Stand, Bed to chair/wheelchair/BSC Sit to Stand: Supervision   Step pivot transfers: Supervision       General transfer comment: slow due to drainage bag    Ambulation/Gait Ambulation/Gait assistance: Contact guard assist Gait Distance (Feet): 200 Feet Assistive device: Rolling walker (2 wheels) Gait Pattern/deviations: Step-through pattern, Decreased step length - right, Decreased step length - left, Trunk flexed Gait velocity: good     General Gait Details: slow and steady with FWW  Stairs            Wheelchair Mobility     Tilt Bed    Modified Rankin (Stroke Patients Only)       Balance Overall balance assessment: Needs assistance Sitting-balance support: No upper extremity supported Sitting balance-Leahy Scale: Normal     Standing balance support: Bilateral upper extremity supported Standing balance-Leahy Scale: Good Standing balance comment: Needs FWW fro good balance                             Pertinent Vitals/Pain Pain Assessment Pain Assessment: No/denies pain    Home Living Family/patient expects to be discharged to:: Private residence Living Arrangements: Spouse/significant other Available Help at Discharge: Available 24 hours/day Type of Home: House Home Access: Level entry       Home Layout: One level Home Equipment: Shower seat - built in      Prior Function Prior Level of Function : Independent/Modified Independent             Mobility Comments: Ind and working Full time at [employer]. ADLs Comments: Independent  Extremity/Trunk Assessment   Upper Extremity Assessment Upper Extremity Assessment: Overall WFL for tasks assessed    Lower Extremity Assessment Lower Extremity Assessment: Generalized weakness       Communication   Communication Communication: No apparent difficulties Cueing Techniques: Verbal cues  Cognition Arousal: Alert Behavior During Therapy:  WFL for tasks assessed/performed Overall Cognitive Status: Within Functional Limits for tasks assessed                                          General Comments      Exercises     Assessment/Plan    PT Assessment Patient needs continued PT services  PT Problem List Decreased strength;Decreased activity tolerance       PT Treatment Interventions Gait training;Therapeutic exercise;Therapeutic activities;Balance training;Neuromuscular re-education;Patient/family education    PT Goals (Current goals can be found in the Care Plan section)  Acute Rehab PT Goals Patient Stated Goal: " Want to go home." PT Goal Formulation: With patient Time For Goal Achievement: 08/08/23 Potential to Achieve Goals: Good    Frequency Min 1X/week     Co-evaluation               AM-PAC PT "6 Clicks" Mobility  Outcome Measure Help needed turning from your back to your side while in a flat bed without using bedrails?: None Help needed moving from lying on your back to sitting on the side of a flat bed without using bedrails?: None Help needed moving to and from a bed to a chair (including a wheelchair)?: A Little Help needed standing up from a chair using your arms (e.g., wheelchair or bedside chair)?: None Help needed to walk in hospital room?: A Little Help needed climbing 3-5 steps with a railing? : A Little 6 Click Score: 21    End of Session Equipment Utilized During Treatment: Gait belt Activity Tolerance: Patient tolerated treatment well Patient left: in chair;with call bell/phone within reach;with chair alarm set;with family/visitor present Nurse Communication: Mobility status PT Visit Diagnosis: Muscle weakness (generalized) (M62.81)    Time: 1410-1436 PT Time Calculation (min) (ACUTE ONLY): 26 min   Charges:   PT Evaluation $PT Eval Moderate Complexity: 1 Mod PT Treatments $Gait Training: 8-22 mins PT General Charges $$ ACUTE PT VISIT: 1 Visit    Janet Berlin PT DPT 3:34 PM,08/01/23

## 2023-08-01 NOTE — Progress Notes (Signed)
Discharged. Education provided to patient and family on how to empty bilary drain. AVS printed and reviewed. All questions answered. Pt waiting on delivery on BSC and Walker .

## 2023-08-03 ENCOUNTER — Other Ambulatory Visit: Payer: Self-pay

## 2023-08-03 ENCOUNTER — Telehealth: Payer: Self-pay

## 2023-08-03 ENCOUNTER — Other Ambulatory Visit (HOSPITAL_COMMUNITY): Payer: Self-pay | Admitting: Radiology

## 2023-08-03 DIAGNOSIS — R160 Hepatomegaly, not elsewhere classified: Secondary | ICD-10-CM

## 2023-08-03 DIAGNOSIS — R7989 Other specified abnormal findings of blood chemistry: Secondary | ICD-10-CM

## 2023-08-03 LAB — SURGICAL PATHOLOGY

## 2023-08-03 NOTE — Telephone Encounter (Signed)
-----   Message from Rickard Patience sent at 08/03/2023  8:42 AM EST ----- Patient was seen by me during admission. Please arrange her to see me this Friday.  Lab LFT and MD  Thanks.   zy

## 2023-08-03 NOTE — Telephone Encounter (Signed)
Morrie Sheldon can you arrange for this patient to see Dr. Cathie Hoops this Friday (12/6).

## 2023-08-07 ENCOUNTER — Inpatient Hospital Stay: Payer: Medicare HMO | Admitting: Oncology

## 2023-08-07 ENCOUNTER — Inpatient Hospital Stay: Payer: Medicare HMO | Attending: Oncology

## 2023-08-07 ENCOUNTER — Other Ambulatory Visit: Payer: Medicare HMO

## 2023-08-07 ENCOUNTER — Telehealth: Payer: Self-pay | Admitting: Oncology

## 2023-08-07 NOTE — Telephone Encounter (Signed)
Called pt spouse and the number did not go through. Called pt number and it went to vm. I left a vm for pt to call back to schedule appts.   If pt calls back please r/s appts (lab/hospital f/u) next week (Dec. 9th-13th) per the MD.

## 2023-08-14 ENCOUNTER — Telehealth: Payer: Self-pay | Admitting: Oncology

## 2023-08-14 NOTE — Telephone Encounter (Signed)
Per MD, reach out to pt to r/s her appts as she is a cancer pt.   I called pt mobile number and left a vm for her to call back to get the appts scheduled. I also called the home/spouse number but it did the beeping noise and did not go through.

## 2023-09-02 DEATH — deceased
